# Patient Record
Sex: Female | Born: 1935 | Race: White | Hispanic: No | Marital: Married | Smoking: Never smoker
Health system: Southern US, Community
[De-identification: ages and names within clinical notes are randomized; demographics above are authoritative.]

## PROBLEM LIST (undated history)

## (undated) DIAGNOSIS — Z87898 Personal history of other specified conditions: Secondary | ICD-10-CM

## (undated) DIAGNOSIS — I10 Essential (primary) hypertension: Secondary | ICD-10-CM

## (undated) DIAGNOSIS — J45909 Unspecified asthma, uncomplicated: Secondary | ICD-10-CM

## (undated) DIAGNOSIS — M419 Scoliosis, unspecified: Secondary | ICD-10-CM

## (undated) DIAGNOSIS — K5790 Diverticulosis of intestine, part unspecified, without perforation or abscess without bleeding: Secondary | ICD-10-CM

## (undated) DIAGNOSIS — K219 Gastro-esophageal reflux disease without esophagitis: Secondary | ICD-10-CM

## (undated) DIAGNOSIS — C50919 Malignant neoplasm of unspecified site of unspecified female breast: Secondary | ICD-10-CM

## (undated) DIAGNOSIS — I2510 Atherosclerotic heart disease of native coronary artery without angina pectoris: Secondary | ICD-10-CM

## (undated) DIAGNOSIS — E119 Type 2 diabetes mellitus without complications: Secondary | ICD-10-CM

## (undated) DIAGNOSIS — E785 Hyperlipidemia, unspecified: Secondary | ICD-10-CM

## (undated) DIAGNOSIS — Z860100 Personal history of colon polyps, unspecified: Secondary | ICD-10-CM

## (undated) DIAGNOSIS — J449 Chronic obstructive pulmonary disease, unspecified: Secondary | ICD-10-CM

## (undated) DIAGNOSIS — F32A Depression, unspecified: Secondary | ICD-10-CM

## (undated) DIAGNOSIS — I509 Heart failure, unspecified: Secondary | ICD-10-CM

## (undated) DIAGNOSIS — G14 Postpolio syndrome: Secondary | ICD-10-CM

## (undated) DIAGNOSIS — C7951 Secondary malignant neoplasm of bone: Secondary | ICD-10-CM

## (undated) DIAGNOSIS — F329 Major depressive disorder, single episode, unspecified: Secondary | ICD-10-CM

## (undated) DIAGNOSIS — F0390 Unspecified dementia without behavioral disturbance: Secondary | ICD-10-CM

## (undated) DIAGNOSIS — G4730 Sleep apnea, unspecified: Secondary | ICD-10-CM

## (undated) DIAGNOSIS — K922 Gastrointestinal hemorrhage, unspecified: Secondary | ICD-10-CM

## (undated) DIAGNOSIS — I4891 Unspecified atrial fibrillation: Secondary | ICD-10-CM

## (undated) HISTORY — DX: Gastro-esophageal reflux disease without esophagitis: K21.9

## (undated) HISTORY — DX: Hyperlipidemia, unspecified: E78.5

## (undated) HISTORY — DX: Heart failure, unspecified: I50.9

## (undated) HISTORY — DX: Secondary malignant neoplasm of bone: C79.51

## (undated) HISTORY — DX: Sleep apnea, unspecified: G47.30

## (undated) HISTORY — DX: Postpolio syndrome: G14

## (undated) HISTORY — DX: Personal history of other specified conditions: Z87.898

## (undated) HISTORY — DX: Personal history of colon polyps, unspecified: Z86.0100

## (undated) HISTORY — DX: Diverticulosis of intestine, part unspecified, without perforation or abscess without bleeding: K57.90

## (undated) HISTORY — DX: Unspecified asthma, uncomplicated: J45.909

## (undated) HISTORY — DX: Personal history of colonic polyps: Z86.010

## (undated) HISTORY — DX: Malignant neoplasm of unspecified site of unspecified female breast: C50.919

## (undated) HISTORY — DX: Atherosclerotic heart disease of native coronary artery without angina pectoris: I25.10

## (undated) HISTORY — DX: Unspecified atrial fibrillation: I48.91

## (undated) HISTORY — DX: Major depressive disorder, single episode, unspecified: F32.9

## (undated) HISTORY — DX: Gastrointestinal hemorrhage, unspecified: K92.2

## (undated) HISTORY — DX: Type 2 diabetes mellitus without complications: E11.9

## (undated) HISTORY — DX: Essential (primary) hypertension: I10

## (undated) HISTORY — PX: BUNIONECTOMY: SHX129

## (undated) HISTORY — DX: Depression, unspecified: F32.A

## (undated) HISTORY — DX: Chronic obstructive pulmonary disease, unspecified: J44.9

## (undated) HISTORY — DX: Scoliosis, unspecified: M41.9

## (undated) HISTORY — PX: OTHER SURGICAL HISTORY: SHX169

## (undated) HISTORY — PX: PTCA: SHX146

---

## 1983-10-12 HISTORY — PX: APPENDECTOMY: SHX54

## 1983-10-12 HISTORY — PX: TOTAL ABDOMINAL HYSTERECTOMY: SHX209

## 1983-10-12 HISTORY — PX: CHOLECYSTECTOMY: SHX55

## 1998-10-11 HISTORY — PX: MASTECTOMY: SHX3

## 1999-03-23 ENCOUNTER — Ambulatory Visit: Admission: RE | Admit: 1999-03-23 | Discharge: 1999-03-23 | Payer: Self-pay | Admitting: Pulmonary Disease

## 1999-05-21 ENCOUNTER — Other Ambulatory Visit: Admission: RE | Admit: 1999-05-21 | Discharge: 1999-05-21 | Payer: Self-pay | Admitting: Internal Medicine

## 1999-06-02 ENCOUNTER — Encounter: Payer: Self-pay | Admitting: Internal Medicine

## 1999-06-02 ENCOUNTER — Encounter (INDEPENDENT_AMBULATORY_CARE_PROVIDER_SITE_OTHER): Payer: Self-pay | Admitting: Specialist

## 1999-06-02 ENCOUNTER — Ambulatory Visit (HOSPITAL_COMMUNITY): Admission: RE | Admit: 1999-06-02 | Discharge: 1999-06-02 | Payer: Self-pay | Admitting: Internal Medicine

## 1999-06-05 ENCOUNTER — Encounter: Admission: RE | Admit: 1999-06-05 | Discharge: 1999-09-03 | Payer: Self-pay | Admitting: *Deleted

## 1999-06-12 ENCOUNTER — Ambulatory Visit (HOSPITAL_COMMUNITY): Admission: RE | Admit: 1999-06-12 | Discharge: 1999-06-13 | Payer: Self-pay | Admitting: Surgery

## 2000-01-11 ENCOUNTER — Encounter: Admission: RE | Admit: 2000-01-11 | Discharge: 2000-01-11 | Payer: Self-pay | Admitting: Surgery

## 2000-01-11 ENCOUNTER — Encounter: Payer: Self-pay | Admitting: Surgery

## 2000-01-25 ENCOUNTER — Encounter: Admission: RE | Admit: 2000-01-25 | Discharge: 2000-01-25 | Payer: Self-pay | Admitting: Hematology and Oncology

## 2000-01-25 ENCOUNTER — Encounter: Payer: Self-pay | Admitting: Hematology and Oncology

## 2000-02-23 ENCOUNTER — Encounter: Payer: Self-pay | Admitting: Hematology and Oncology

## 2000-02-23 ENCOUNTER — Encounter: Admission: RE | Admit: 2000-02-23 | Discharge: 2000-02-23 | Payer: Self-pay | Admitting: Hematology and Oncology

## 2000-05-31 ENCOUNTER — Encounter: Admission: RE | Admit: 2000-05-31 | Discharge: 2000-05-31 | Payer: Self-pay | Admitting: Hematology and Oncology

## 2000-05-31 ENCOUNTER — Encounter: Payer: Self-pay | Admitting: Hematology and Oncology

## 2000-06-02 ENCOUNTER — Encounter: Admission: RE | Admit: 2000-06-02 | Discharge: 2000-06-02 | Payer: Self-pay | Admitting: Hematology and Oncology

## 2000-06-02 ENCOUNTER — Encounter: Payer: Self-pay | Admitting: Hematology and Oncology

## 2000-07-20 ENCOUNTER — Ambulatory Visit (HOSPITAL_COMMUNITY): Admission: RE | Admit: 2000-07-20 | Discharge: 2000-07-21 | Payer: Self-pay | Admitting: Cardiology

## 2001-02-22 ENCOUNTER — Encounter: Payer: Self-pay | Admitting: Hematology and Oncology

## 2001-02-22 ENCOUNTER — Encounter: Admission: RE | Admit: 2001-02-22 | Discharge: 2001-02-22 | Payer: Self-pay | Admitting: Hematology and Oncology

## 2001-06-27 ENCOUNTER — Encounter: Payer: Self-pay | Admitting: Internal Medicine

## 2001-06-27 ENCOUNTER — Encounter: Admission: RE | Admit: 2001-06-27 | Discharge: 2001-06-27 | Payer: Self-pay | Admitting: Internal Medicine

## 2001-07-14 ENCOUNTER — Encounter: Admission: RE | Admit: 2001-07-14 | Discharge: 2001-07-14 | Payer: Self-pay | Admitting: Internal Medicine

## 2001-07-14 ENCOUNTER — Encounter: Payer: Self-pay | Admitting: Internal Medicine

## 2001-08-16 ENCOUNTER — Encounter: Payer: Self-pay | Admitting: Hematology and Oncology

## 2001-08-16 ENCOUNTER — Encounter: Admission: RE | Admit: 2001-08-16 | Discharge: 2001-08-16 | Payer: Self-pay | Admitting: Oncology

## 2002-03-06 ENCOUNTER — Encounter: Admission: RE | Admit: 2002-03-06 | Discharge: 2002-03-06 | Payer: Self-pay | Admitting: Hematology and Oncology

## 2002-03-06 ENCOUNTER — Encounter: Payer: Self-pay | Admitting: Hematology and Oncology

## 2002-06-19 ENCOUNTER — Encounter: Payer: Self-pay | Admitting: Hematology and Oncology

## 2002-06-19 ENCOUNTER — Encounter: Admission: RE | Admit: 2002-06-19 | Discharge: 2002-06-19 | Payer: Self-pay | Admitting: Hematology and Oncology

## 2002-06-22 ENCOUNTER — Encounter: Payer: Self-pay | Admitting: Hematology and Oncology

## 2002-06-22 ENCOUNTER — Encounter: Admission: RE | Admit: 2002-06-22 | Discharge: 2002-06-22 | Payer: Self-pay | Admitting: Hematology and Oncology

## 2002-07-31 ENCOUNTER — Encounter: Admission: RE | Admit: 2002-07-31 | Discharge: 2002-07-31 | Payer: Self-pay | Admitting: *Deleted

## 2002-07-31 ENCOUNTER — Encounter: Payer: Self-pay | Admitting: *Deleted

## 2002-08-06 ENCOUNTER — Encounter: Payer: Self-pay | Admitting: *Deleted

## 2002-08-06 ENCOUNTER — Ambulatory Visit (HOSPITAL_COMMUNITY): Admission: RE | Admit: 2002-08-06 | Discharge: 2002-08-06 | Payer: Self-pay | Admitting: *Deleted

## 2003-01-25 ENCOUNTER — Encounter: Payer: Self-pay | Admitting: *Deleted

## 2003-01-25 ENCOUNTER — Ambulatory Visit (HOSPITAL_COMMUNITY): Admission: RE | Admit: 2003-01-25 | Discharge: 2003-01-25 | Payer: Self-pay | Admitting: *Deleted

## 2003-08-13 ENCOUNTER — Encounter: Admission: RE | Admit: 2003-08-13 | Discharge: 2003-08-13 | Payer: Self-pay | Admitting: Hematology & Oncology

## 2003-11-13 ENCOUNTER — Encounter: Payer: Self-pay | Admitting: Internal Medicine

## 2004-08-12 ENCOUNTER — Ambulatory Visit (HOSPITAL_COMMUNITY): Admission: RE | Admit: 2004-08-12 | Discharge: 2004-08-12 | Payer: Self-pay | Admitting: Oncology

## 2004-08-15 ENCOUNTER — Ambulatory Visit: Payer: Self-pay | Admitting: Oncology

## 2004-08-17 ENCOUNTER — Encounter: Admission: RE | Admit: 2004-08-17 | Discharge: 2004-08-17 | Payer: Self-pay | Admitting: Surgery

## 2005-02-15 ENCOUNTER — Ambulatory Visit: Payer: Self-pay | Admitting: Oncology

## 2005-02-24 ENCOUNTER — Ambulatory Visit (HOSPITAL_COMMUNITY): Admission: RE | Admit: 2005-02-24 | Discharge: 2005-02-24 | Payer: Self-pay | Admitting: Oncology

## 2005-03-14 ENCOUNTER — Inpatient Hospital Stay (HOSPITAL_COMMUNITY): Admission: EM | Admit: 2005-03-14 | Discharge: 2005-03-16 | Payer: Self-pay | Admitting: Emergency Medicine

## 2005-04-05 ENCOUNTER — Ambulatory Visit: Payer: Self-pay | Admitting: Oncology

## 2005-08-18 ENCOUNTER — Encounter: Admission: RE | Admit: 2005-08-18 | Discharge: 2005-08-18 | Payer: Self-pay | Admitting: Oncology

## 2006-01-18 ENCOUNTER — Ambulatory Visit: Payer: Self-pay | Admitting: Oncology

## 2006-02-21 ENCOUNTER — Ambulatory Visit (HOSPITAL_COMMUNITY): Admission: RE | Admit: 2006-02-21 | Discharge: 2006-02-21 | Payer: Self-pay | Admitting: Oncology

## 2006-03-25 ENCOUNTER — Encounter: Admission: RE | Admit: 2006-03-25 | Discharge: 2006-03-25 | Payer: Self-pay | Admitting: Orthopedic Surgery

## 2006-06-16 ENCOUNTER — Encounter: Admission: RE | Admit: 2006-06-16 | Discharge: 2006-06-16 | Payer: Self-pay | Admitting: Orthopedic Surgery

## 2006-08-22 ENCOUNTER — Encounter: Admission: RE | Admit: 2006-08-22 | Discharge: 2006-08-22 | Payer: Self-pay | Admitting: Oncology

## 2007-02-20 ENCOUNTER — Encounter: Admission: RE | Admit: 2007-02-20 | Discharge: 2007-02-20 | Payer: Self-pay | Admitting: Internal Medicine

## 2007-02-24 ENCOUNTER — Encounter: Admission: RE | Admit: 2007-02-24 | Discharge: 2007-02-24 | Payer: Self-pay | Admitting: Orthopedic Surgery

## 2007-02-24 ENCOUNTER — Ambulatory Visit: Payer: Self-pay | Admitting: Oncology

## 2007-02-27 LAB — CBC WITH DIFFERENTIAL (CANCER CENTER ONLY)
BASO%: 0.5 % (ref 0.0–2.0)
EOS%: 1.2 % (ref 0.0–7.0)
LYMPH%: 12.8 % — ABNORMAL LOW (ref 14.0–48.0)
MCH: 30 pg (ref 26.0–34.0)
MCHC: 33.7 g/dL (ref 32.0–36.0)
MCV: 89 fL (ref 81–101)
MONO%: 6.6 % (ref 0.0–13.0)
NEUT#: 9.1 10*3/uL — ABNORMAL HIGH (ref 1.5–6.5)
Platelets: 249 10*3/uL (ref 145–400)
RBC: 4.48 10*6/uL (ref 3.70–5.32)
RDW: 13.2 % (ref 10.5–14.6)

## 2007-02-27 LAB — COMPREHENSIVE METABOLIC PANEL
Albumin: 4 g/dL (ref 3.5–5.2)
CO2: 34 mEq/L — ABNORMAL HIGH (ref 19–32)
Calcium: 10 mg/dL (ref 8.4–10.5)
Chloride: 101 mEq/L (ref 96–112)
Glucose, Bld: 126 mg/dL — ABNORMAL HIGH (ref 70–99)
Potassium: 4.2 mEq/L (ref 3.5–5.3)
Sodium: 144 mEq/L (ref 135–145)
Total Protein: 6.3 g/dL (ref 6.0–8.3)

## 2007-02-27 LAB — LACTATE DEHYDROGENASE: LDH: 129 U/L (ref 94–250)

## 2007-02-27 LAB — CANCER ANTIGEN 27.29: CA 27.29: 19 U/mL (ref 0–39)

## 2007-03-03 ENCOUNTER — Ambulatory Visit (HOSPITAL_COMMUNITY): Admission: RE | Admit: 2007-03-03 | Discharge: 2007-03-03 | Payer: Self-pay | Admitting: Oncology

## 2007-07-14 ENCOUNTER — Encounter: Payer: Self-pay | Admitting: Internal Medicine

## 2007-08-24 ENCOUNTER — Encounter: Admission: RE | Admit: 2007-08-24 | Discharge: 2007-08-24 | Payer: Self-pay | Admitting: Oncology

## 2007-08-25 ENCOUNTER — Ambulatory Visit: Payer: Self-pay | Admitting: Oncology

## 2007-08-28 LAB — CBC WITH DIFFERENTIAL (CANCER CENTER ONLY)
BASO%: 0.6 % (ref 0.0–2.0)
Eosinophils Absolute: 0.2 10*3/uL (ref 0.0–0.5)
LYMPH%: 15.8 % (ref 14.0–48.0)
MONO#: 0.6 10*3/uL (ref 0.1–0.9)
NEUT#: 6.8 10*3/uL — ABNORMAL HIGH (ref 1.5–6.5)
Platelets: 202 10*3/uL (ref 145–400)
RBC: 4.43 10*6/uL (ref 3.70–5.32)
RDW: 13.2 % (ref 10.5–14.6)
WBC: 9 10*3/uL (ref 3.9–10.0)

## 2007-08-28 LAB — COMPREHENSIVE METABOLIC PANEL
ALT: 12 U/L (ref 0–35)
AST: 12 U/L (ref 0–37)
Creatinine, Ser: 0.79 mg/dL (ref 0.40–1.20)
Total Bilirubin: 1.2 mg/dL (ref 0.3–1.2)

## 2007-09-04 ENCOUNTER — Encounter: Admission: RE | Admit: 2007-09-04 | Discharge: 2007-09-04 | Payer: Self-pay | Admitting: Internal Medicine

## 2008-04-11 ENCOUNTER — Encounter: Admission: RE | Admit: 2008-04-11 | Discharge: 2008-07-10 | Payer: Self-pay | Admitting: Internal Medicine

## 2008-08-21 ENCOUNTER — Ambulatory Visit: Payer: Self-pay | Admitting: Oncology

## 2008-08-26 ENCOUNTER — Encounter: Admission: RE | Admit: 2008-08-26 | Discharge: 2008-08-26 | Payer: Self-pay | Admitting: Internal Medicine

## 2008-08-27 LAB — CMP (CANCER CENTER ONLY)
Alkaline Phosphatase: 35 U/L (ref 26–84)
BUN, Bld: 12 mg/dL (ref 7–22)
Glucose, Bld: 191 mg/dL — ABNORMAL HIGH (ref 73–118)
Sodium: 144 mEq/L (ref 128–145)
Total Bilirubin: 1.4 mg/dl (ref 0.20–1.60)
Total Protein: 6.5 g/dL (ref 6.4–8.1)

## 2008-08-27 LAB — CBC WITH DIFFERENTIAL (CANCER CENTER ONLY)
BASO#: 0.1 10*3/uL (ref 0.0–0.2)
EOS%: 1.9 % (ref 0.0–7.0)
Eosinophils Absolute: 0.2 10*3/uL (ref 0.0–0.5)
HGB: 12.7 g/dL (ref 11.6–15.9)
LYMPH%: 17.3 % (ref 14.0–48.0)
MCH: 29.6 pg (ref 26.0–34.0)
MCHC: 33.6 g/dL (ref 32.0–36.0)
MCV: 88 fL (ref 81–101)
MONO%: 6.7 % (ref 0.0–13.0)
NEUT#: 5.7 10*3/uL (ref 1.5–6.5)
NEUT%: 73.3 % (ref 39.6–80.0)

## 2009-02-03 ENCOUNTER — Encounter: Payer: Self-pay | Admitting: Internal Medicine

## 2009-02-07 ENCOUNTER — Encounter: Payer: Self-pay | Admitting: Internal Medicine

## 2009-02-12 DIAGNOSIS — E785 Hyperlipidemia, unspecified: Secondary | ICD-10-CM

## 2009-02-12 DIAGNOSIS — I10 Essential (primary) hypertension: Secondary | ICD-10-CM | POA: Insufficient documentation

## 2009-02-12 DIAGNOSIS — E119 Type 2 diabetes mellitus without complications: Secondary | ICD-10-CM

## 2009-02-13 ENCOUNTER — Ambulatory Visit: Payer: Self-pay | Admitting: Internal Medicine

## 2009-02-13 ENCOUNTER — Encounter: Payer: Self-pay | Admitting: Internal Medicine

## 2009-02-13 ENCOUNTER — Ambulatory Visit: Admission: RE | Admit: 2009-02-13 | Discharge: 2009-02-13 | Payer: Self-pay | Admitting: Internal Medicine

## 2009-02-13 DIAGNOSIS — M4120 Other idiopathic scoliosis, site unspecified: Secondary | ICD-10-CM | POA: Insufficient documentation

## 2009-02-13 DIAGNOSIS — I2510 Atherosclerotic heart disease of native coronary artery without angina pectoris: Secondary | ICD-10-CM | POA: Insufficient documentation

## 2009-02-14 ENCOUNTER — Encounter: Payer: Self-pay | Admitting: Internal Medicine

## 2009-02-14 LAB — CONVERTED CEMR LAB
CK-MB: 1.6 ng/mL (ref 0.3–4.0)
Total CK: 34 units/L (ref 7–177)
Troponin I: 0.05 ng/mL (ref <0.06)

## 2009-02-21 ENCOUNTER — Ambulatory Visit: Payer: Self-pay | Admitting: Internal Medicine

## 2009-02-21 ENCOUNTER — Telehealth: Payer: Self-pay | Admitting: Internal Medicine

## 2009-04-03 ENCOUNTER — Encounter (HOSPITAL_COMMUNITY): Admission: RE | Admit: 2009-04-03 | Discharge: 2009-07-02 | Payer: Self-pay | Admitting: Internal Medicine

## 2009-05-15 ENCOUNTER — Ambulatory Visit: Payer: Self-pay | Admitting: Pulmonary Disease

## 2009-05-15 ENCOUNTER — Telehealth (INDEPENDENT_AMBULATORY_CARE_PROVIDER_SITE_OTHER): Payer: Self-pay | Admitting: *Deleted

## 2009-05-21 ENCOUNTER — Encounter: Payer: Self-pay | Admitting: Pulmonary Disease

## 2009-05-23 ENCOUNTER — Encounter: Payer: Self-pay | Admitting: Pulmonary Disease

## 2009-05-23 ENCOUNTER — Ambulatory Visit: Payer: Self-pay | Admitting: Internal Medicine

## 2009-05-30 ENCOUNTER — Telehealth: Payer: Self-pay | Admitting: Internal Medicine

## 2009-06-29 ENCOUNTER — Encounter: Payer: Self-pay | Admitting: Internal Medicine

## 2009-08-04 ENCOUNTER — Encounter: Payer: Self-pay | Admitting: Internal Medicine

## 2009-08-05 ENCOUNTER — Inpatient Hospital Stay (HOSPITAL_COMMUNITY): Admission: EM | Admit: 2009-08-05 | Discharge: 2009-08-08 | Payer: Self-pay | Admitting: Emergency Medicine

## 2009-08-25 ENCOUNTER — Encounter: Admission: RE | Admit: 2009-08-25 | Discharge: 2009-08-25 | Payer: Self-pay | Admitting: Internal Medicine

## 2009-08-25 ENCOUNTER — Ambulatory Visit: Payer: Self-pay | Admitting: Oncology

## 2009-08-26 LAB — CBC WITH DIFFERENTIAL (CANCER CENTER ONLY)
BASO#: 0 10*3/uL (ref 0.0–0.2)
EOS%: 2.3 % (ref 0.0–7.0)
Eosinophils Absolute: 0.2 10*3/uL (ref 0.0–0.5)
HCT: 40.1 % (ref 34.8–46.6)
HGB: 13.4 g/dL (ref 11.6–15.9)
LYMPH#: 1.2 10*3/uL (ref 0.9–3.3)
MCH: 31.4 pg (ref 26.0–34.0)
MCHC: 33.4 g/dL (ref 32.0–36.0)
NEUT%: 73.2 % (ref 39.6–80.0)
RBC: 4.27 10*6/uL (ref 3.70–5.32)

## 2009-08-26 LAB — CMP (CANCER CENTER ONLY)
Albumin: 3.3 g/dL (ref 3.3–5.5)
Alkaline Phosphatase: 47 U/L (ref 26–84)
Glucose, Bld: 156 mg/dL — ABNORMAL HIGH (ref 73–118)
Potassium: 4.2 mEq/L (ref 3.3–4.7)
Sodium: 148 mEq/L — ABNORMAL HIGH (ref 128–145)
Total Protein: 6.3 g/dL — ABNORMAL LOW (ref 6.4–8.1)

## 2010-08-04 ENCOUNTER — Ambulatory Visit: Payer: Self-pay | Admitting: Internal Medicine

## 2010-08-13 ENCOUNTER — Ambulatory Visit: Payer: Self-pay | Admitting: Oncology

## 2010-08-26 LAB — CBC WITH DIFFERENTIAL (CANCER CENTER ONLY)
BASO#: 0 10*3/uL (ref 0.0–0.2)
BASO%: 0.4 % (ref 0.0–2.0)
EOS%: 1.7 % (ref 0.0–7.0)
Eosinophils Absolute: 0.1 10*3/uL (ref 0.0–0.5)
HCT: 37.7 % (ref 34.8–46.6)
HGB: 12.9 g/dL (ref 11.6–15.9)
LYMPH#: 1.2 10*3/uL (ref 0.9–3.3)
LYMPH%: 15.9 % (ref 14.0–48.0)
MCH: 30.2 pg (ref 26.0–34.0)
MCHC: 34.2 g/dL (ref 32.0–36.0)
MCV: 88 fL (ref 81–101)
MONO#: 0.5 10*3/uL (ref 0.1–0.9)
MONO%: 6.4 % (ref 0.0–13.0)
NEUT#: 5.9 10*3/uL (ref 1.5–6.5)
NEUT%: 75.6 % (ref 39.6–80.0)
Platelets: 187 10*3/uL (ref 145–400)
RBC: 4.27 10*6/uL (ref 3.70–5.32)
RDW: 12.2 % (ref 10.5–14.6)
WBC: 7.7 10*3/uL (ref 3.9–10.0)

## 2010-08-26 LAB — CMP (CANCER CENTER ONLY)
ALT(SGPT): 15 U/L (ref 10–47)
AST: 19 U/L (ref 11–38)
Albumin: 3.7 g/dL (ref 3.3–5.5)
Alkaline Phosphatase: 36 U/L (ref 26–84)
BUN, Bld: 16 mg/dL (ref 7–22)
CO2: 35 mEq/L — ABNORMAL HIGH (ref 18–33)
Calcium: 10.2 mg/dL (ref 8.0–10.3)
Chloride: 98 mEq/L (ref 98–108)
Creat: 0.7 mg/dl (ref 0.6–1.2)
Glucose, Bld: 177 mg/dL — ABNORMAL HIGH (ref 73–118)
Potassium: 4.3 mEq/L (ref 3.3–4.7)
Sodium: 143 mEq/L (ref 128–145)
Total Bilirubin: 1 mg/dl (ref 0.20–1.60)
Total Protein: 6.5 g/dL (ref 6.4–8.1)

## 2010-08-26 LAB — CANCER ANTIGEN 27.29: CA 27.29: 21 U/mL (ref 0–39)

## 2010-08-28 ENCOUNTER — Encounter: Admission: RE | Admit: 2010-08-28 | Discharge: 2010-08-28 | Payer: Self-pay | Admitting: Internal Medicine

## 2010-09-22 ENCOUNTER — Telehealth: Payer: Self-pay | Admitting: Internal Medicine

## 2010-09-25 ENCOUNTER — Ambulatory Visit: Payer: Self-pay | Admitting: Internal Medicine

## 2010-10-01 LAB — CONVERTED CEMR LAB
BUN: 15 mg/dL (ref 6–23)
Basophils Absolute: 0 10*3/uL (ref 0.0–0.1)
Basophils Relative: 0.3 % (ref 0.0–3.0)
CO2: 37 meq/L — ABNORMAL HIGH (ref 19–32)
Calcium: 9.8 mg/dL (ref 8.4–10.5)
Chloride: 98 meq/L (ref 96–112)
Creatinine, Ser: 0.8 mg/dL (ref 0.4–1.2)
Eosinophils Absolute: 0.1 10*3/uL (ref 0.0–0.7)
Eosinophils Relative: 1.7 % (ref 0.0–5.0)
GFR calc non Af Amer: 80.09 mL/min (ref >60.00)
Glucose, Bld: 240 mg/dL — ABNORMAL HIGH (ref 70–99)
HCT: 37.3 % (ref 36.0–46.0)
Hemoglobin: 12.4 g/dL (ref 12.0–15.0)
Lymphocytes Relative: 12.3 % (ref 12.0–46.0)
Lymphs Abs: 1 10*3/uL (ref 0.7–4.0)
MCHC: 33.2 g/dL (ref 30.0–36.0)
MCV: 91.8 fL (ref 78.0–100.0)
Monocytes Absolute: 0.6 10*3/uL (ref 0.1–1.0)
Monocytes Relative: 6.9 % (ref 3.0–12.0)
Neutro Abs: 6.6 10*3/uL (ref 1.4–7.7)
Neutrophils Relative %: 78.8 % — ABNORMAL HIGH (ref 43.0–77.0)
Platelets: 224 10*3/uL (ref 150.0–400.0)
Potassium: 4.1 meq/L (ref 3.5–5.1)
Pro B Natriuretic peptide (BNP): 145.4 pg/mL — ABNORMAL HIGH (ref 0.0–100.0)
RBC: 4.07 M/uL (ref 3.87–5.11)
RDW: 13.4 % (ref 11.5–14.6)
Sodium: 142 meq/L (ref 135–145)
WBC: 8.4 10*3/uL (ref 4.5–10.5)

## 2010-10-27 ENCOUNTER — Encounter: Payer: Self-pay | Admitting: Internal Medicine

## 2010-10-27 ENCOUNTER — Ambulatory Visit
Admission: RE | Admit: 2010-10-27 | Discharge: 2010-10-27 | Payer: Self-pay | Source: Home / Self Care | Attending: Internal Medicine | Admitting: Internal Medicine

## 2010-11-02 ENCOUNTER — Encounter: Payer: Self-pay | Admitting: Internal Medicine

## 2010-11-06 ENCOUNTER — Encounter: Payer: Self-pay | Admitting: Internal Medicine

## 2010-11-10 NOTE — Assessment & Plan Note (Signed)
Summary: breathing problem/ mbw   Visit Type:  Follow-up Copy to:  Dr. Pearson Grippe Primary Provider/Referring Provider:  Dr. Pearson Grippe  CC:  follow up. pt states she feels like she can't catch her breath. pt c/o sob with activity and getting worse.  pt wears o2 all day and cpap at night. pt denies any cough.  History of Present Illness: 77 yo female with dyspnea likely from Hypoxemia, Scoliosis, and OSA and Abn PFT( c/w restriction from scoliosis,  airtrapping ? cause, and low dlco - ? cause - maintained on COPD Rx)    OV 05/23/2009: Saw Dr Craige Cotta last week becuase she was desaturating and getting dyspneic with exertion while at rehab. She was advised to increase her o2 from 2L at rest to 3L with exertion. With this measure she feels beter. No active complaints. Enjoys rehab. Baseline dyspnea and cough are unchanged   August 04, 2010: Followup viist after nearly a year. In interim, was admitted to [facility] O[facility] 2010 for atypical chest pain. Reviw of records shows Left heart cath normal. No right heart cath performed. COmpleted rehab 1 year ago. [employer] regularly. Feels exercise is not helping. Now, hHas noticed worsening dyspnea last several months even for smiple activities like going across room. O2 upped by husband to 4L based on finger pulse ox probe at home. Husband feels active exercise of polio patient is fatiguging muscles and making her more dyspneic. Denies asspciated chest pain, cough, wheeze, edema, hemoptysius, sputum, nausea, vomit, diarrhea, syncope, orthopnea, pnd.     Preventive Screening-Counseling & Management  Alcohol-Tobacco     Smoking Status: never  Current Medications (verified): 1)  Spiriva Handihaler 18 Mcg Caps (Tiotropium Bromide Monohydrate) .... One Capsule Inhaled Once A Day 2)  Advair Diskus 250-50 Mcg/dose Misc (Fluticasone-Salmeterol) .... One Puff Two Times A Day 3)  Allegra 180 Mg Tabs (Fexofenadine Hcl) .... Take 1 Tablet By Mouth Once A Day As  Needed 4)  Glimepiride 2 Mg Tabs (Glimepiride) .... Take 1 Tablet By Mouth Once A Day 5)  Adult Aspirin Ec Low Strength 81 Mg Tbec (Aspirin) .... Two Tablets A Day 6)  Plavix 75 Mg Tabs (Clopidogrel Bisulfate) .... Take 1 Tablet By Mouth Once A Day 7)  Digoxin 0.25 Mg Tabs (Digoxin) .... Take 1 Tablet By Mouth Once A Day 8)  Diovan 160 Mg Tabs (Valsartan) .... 1/2 Tablet in Am and Pm 9)  Isosorbide Mononitrate Cr 30 Mg Xr24h-Tab (Isosorbide Mononitrate) .... Once Daily 10)  Januvia 100 Mg Tabs (Sitagliptin Phosphate) .... Take 1 Tablet By Mouth Once A Day 11)  Pravastatin Sodium 80 Mg Tabs (Pravastatin Sodium) .... Take 1 Tablet By Mouth Once A Day 12)  Metoprolol Tartrate 50 Mg Tabs (Metoprolol Tartrate) .... Take 1 Tablet By Mouth Once A Day 13)  Nitroglycerin 0.4 Mg Subl (Nitroglycerin) .... As Needed 14)  Protonix 40 Mg Tbec (Pantoprazole Sodium) .... Take 1 Tablet By Mouth Once A Day 15)  Torsemide 20 Mg Tabs (Torsemide) .... Take 1 Tablet By Mouth Once A Day As Needed 16)  Folic Acid 800 Mcg Tabs (Folic Acid) .... Take 1 Tablet By Mouth Once A Day 17)  Glucosamine Sulfate 750 Mg Tabs (Glucosamine Sulfate) .... Take 1 Tablet By Mouth Three Times A Day 18)  Multivitamins  Tabs (Multiple Vitamin) .... Take 1 Tablet By Mouth Once A Day 19)  Tylenol Pm Extra Strength 500-25 Mg Tabs (Diphenhydramine-Apap (Sleep)) .... 1/2 A Day As Needed Sleep 20)  Propoxyphene N-Apap 100-650  Mg Tabs (Propoxyphene N-Apap) .... Take 1 Tablet By Mouth Once A Day As Needed 21)  Singulair 10 Mg Tabs (Montelukast Sodium) .... Once Daily As Needed 22)  Oxycodone-Acetaminophen 7.5-325 Mg Tabs (Oxycodone-Acetaminophen) .... Once Daily  Allergies (verified): 1)  ! Persantine 2)  ! * Dicloxicilin  Past History:  Past medical, surgical, family and social histories (including risk factors) reviewed, and no changes noted (except as noted below).  Past Medical History: Reviewed history from 05/23/2009 and no changes  required. #Diabetes, Type 2 #Hyperlipidemia #Hypertension #Atrial Fibrillation #CAD - snce 1990s. Most of care in [place] - 2001 Cath at [facility] by Dr Debby Freiberg - Single 95% RCA with normal LVEF - s/p co stent  - last one 2004. Typical symptoms are chest congestion but not chest pain #"MIXED RESTRICTIVE-OBSTRUCTIVE PFT ABNORMALITY with DYSPNEA and HYPOXEMIA - Labelled as COPD but never smoked, no childhood asthma, no occ dust exposure.  - 07/14/2007 CXR - scoliosis - ABG 02/13/2009: 7.4/53/53.  - PFTs 02/13/2009 done on copd Rx (spiriva and advair). Cleda Daub shows restriction. Lung volumes suggests air trapping. Diffusion is very severely low. : FVC 1L/50%, FeV1 1L/72%, Ratio 98 (72), Fef 25-75% 1.6L/86%, TLCO 3.6/102%, RV 2.6/177%, RV/TLC 72% (41), DLCO 8.3/46 #Sleep Apnea #Hx of Breast Cancer with Right mastectomy 06/12/99 - negative bone scan 2008 per echart - negative mammogram 08/2008 per echart #Hx of Gi Bleed UGI And LGI bleed and GERD #Post Polio Syndrome with severe scoliosis - severe scoliosois with progressive loss of ht - chronic back pain - uses stimulator Cataracts# #Labs 02/04/2009: Alb 3.9, Creat 0.6, Glucose 100, BUN 13, AST 7, ALT 9,  HGB A1C 6.6  Past Surgical History: Reviewed history from 02/13/2009 and no changes required. [id number] Bunionectomy Left Elbow surgery Right Mastectomy 2000 Gsllbladder -1980 Appendix-1985  Family History: Reviewed history from 02/12/2009 and no changes required. Father-died at age 10 from MI Mother- bladder Cancer, hypercalcemia  Social History: Reviewed history from 02/12/2009 and no changes required. Married 4 children-all boys Never Smoked No ETOH useSmoking Status:  never  Review of Systems       The patient complains of shortness of breath with activity, acid heartburn, nasal congestion/difficulty breathing through nose, hand/feet swelling, and joint stiffness or pain.  The patient denies shortness of breath at rest, productive  cough, non-productive cough, coughing up blood, chest pain, irregular heartbeats, indigestion, loss of appetite, weight change, abdominal pain, difficulty swallowing, sore throat, tooth/dental problems, headaches, sneezing, itching, ear ache, anxiety, depression, rash, change in color of mucus, and fever.    Vital Signs:  Patient profile:   77 year old female Height:      58 inches Weight:      151.25 pounds BMI:     31.73 O2 Sat:      96 % on 3 L/min Temp:     98.0 degrees F oral Pulse rate:   69 / minute BP sitting:   130 / 68  (left arm) Cuff size:   regular  Vitals Entered By: Carver Fila (August 04, 2010 1:54 PM)  O2 Flow:  3 L/min CC: follow up. pt states she feels like she can't catch her breath. pt c/o sob with activity and getting worse.  pt wears o2 all day and cpap at night. pt denies any cough Comments meds and allergies updated Phone number updated Carver Fila  August 04, 2010 1:55 PM    Physical Exam  General:  short woman sitting in wheel chair with o2 on Head:  normocephalic and atraumatic Eyes:  PERRLA/EOM intact; conjunctiva and sclera clear Ears:  TMs intact and clear with normal canals Nose:  no deformity, discharge, inflammation, or lesions Mouth:  no deformity or lesions Neck:  no masses, thyromegaly, or abnormal cervical nodes Chest Wall:  severe S shapped scoliosis small chest barrell chest Lungs:  coarse BS throughout and prolonged exhilation.   Heart:  regular rate and rhythm, S1, S2 without rubs, gallops, or clicks. Murmur NOS present.  Abdomen:  bowel sounds positive; abdomen soft and non-tender without masses, or organomegaly Msk:  no deformity or scoliosis noted with normal posture Pulses:  pulses normal Extremities:  minimal ankle edema Neurologic:  CN II-XII grossly intact with normal reflexes, coordination, muscle strength and tone Skin:  intact without lesions or rashes Cervical Nodes:  no significant adenopathy Axillary Nodes:  no  significant adenopathy Psych:  alert and cooperative; normal mood and affect; normal attention span and concentration   Impression & Recommendations:  Problem # 1:  SHORTNESS OF BREATH (SOB) (ICD-786.05) Assessment Deteriorated  >I think dyspnea is multifactorial. Primary contributor is restrictive chest diseaes from severe S shaped scoliosis and from large hiatal hernia that is pushing into her left chest (hernia is unchanged from 2004 cxr). She is labelled to have COPD (per her) which I suspect is due to air trapping on lung volumes and reduced diffusion. However, I think she might or might not have asthma ( given hx of variable peak flow) or has air trapping due to years of scoliosis.  Since Summer 2010  -> rehab an exercise have not helped. Left heart cath oct 2010 is nromal.  Fall 2011 - worsening dyspnea: suspect early decompnesation of right heart falure but husband and patient think this is due to resistancet training at [facility] in setting of polio  PLAN Continue o2 Stop ecercises at [facility] at their request Patient will call back in 1 month to report progres in dyspne and hypoxemia If still persists, can check bNP Explained that corpulmonale is an eventuality and once that happens, no specific Rx options available  Orders: Est. Patient Level II ([id number])  Medications Added to Medication List This Visit: 1)  Isosorbide Mononitrate Cr 30 Mg Xr24h-tab (Isosorbide mononitrate) .... Once daily 2)  Singulair 10 Mg Tabs (Montelukast sodium) .... Once daily as needed 3)  Oxycodone-acetaminophen 7.5-325 Mg Tabs (Oxycodone-acetaminophen) .... Once daily  Patient Instructions: 1)  please stop [employer] exercises 2)  call me in 1 month to report progrss 3)  monitor your oxygen levels   Immunization History:  Influenza Immunization History:    Influenza:  historical (06/11/2010)

## 2010-11-12 NOTE — Assessment & Plan Note (Signed)
Summary: sob//jrc   Visit Type:  Follow-up Copy to:  Dr. Pearson Grippe Primary Provider/Referring Provider:  Dr. Pearson Grippe  CC:  Pt here for follow-up. Pt states she has been having increased SOB x 3 months.  pt states when she walks around 2feet her oxygen levels drop below 90%. .  History of Present Illness: 77 yo female with dyspnea likely from Hypoxemia, Scoliosis, and OSA and Abn PFT( c/w restriction from scoliosis,  airtrapping ? cause, and low dlco - ? cause - maintained on COPD Rx)    August 04, 2010: Followup viist after nearly a year. COmpleted rehab 1 year ago.  In interim, was admitted to [facility] O[facility] 2010 for atypical chest pain. Reviw of records shows Left heart cath normal showed patent LAD and RCA stent. No right heart cath performed. [employer] regularly.  Now, has noticed worsening dyspnea last several months even for smiple activities like going across room. O2 upped by husband to 4L based on finger pulse ox probe at home. Husband feels active exercise of polio patient is fatiguging muscles and making her more dyspneic. Denies asspciated chest pain, cough, wheeze, edema, hemoptysius, sputum, nausea, vomit, diarrhea, syncope, orthopnea, pnd. WJX:BJYN [employer] exercises, REASSESS OVER PHONE IN 1 MONTH, DO LABS -> BNP 145, Hgb 12.5gm%. ROV 3 months   October 27, 2010: Followup for above. States that since labor day weekend dyspneic with minimal exertion. This is worse than baseline. Husband states something changed around that time. Stopping [employer] exercises have not helped. Dyspnea is progressive. More hypoxemic too. Does hvae occ heart burns that mimic old gerd v CAD. No definite chest pains, worsening edema, syncope, nausea, vomit, diarrhea. She does have intentional weight loss due to diet.  Spirometry today shows no change comparted to past     Preventive Screening-Counseling & Management  Alcohol-Tobacco     Smoking Status: never  Current Medications (verified): 1)  Spiriva  Handihaler 18 Mcg Caps (Tiotropium Bromide Monohydrate) .... One Capsule Inhaled Once A Day 2)  Advair Diskus 250-50 Mcg/dose Misc (Fluticasone-Salmeterol) .... One Puff Two Times A Day 3)  Allegra 180 Mg Tabs (Fexofenadine Hcl) .... Take 1 Tablet By Mouth Once A Day As Needed 4)  Glimepiride 2 Mg Tabs (Glimepiride) .... Take 1 Tablet By Mouth Once A Day 5)  Adult Aspirin Ec Low Strength 81 Mg Tbec (Aspirin) .... Two Tablets A Day 6)  Plavix 75 Mg Tabs (Clopidogrel Bisulfate) .... Take 1 Tablet By Mouth Once A Day 7)  Digoxin 0.25 Mg Tabs (Digoxin) .... Take 1 Tablet By Mouth Once A Day 8)  Diovan 160 Mg Tabs (Valsartan) .... 1/2 Tablet in Am and Pm 9)  Isosorbide Mononitrate Cr 30 Mg Xr24h-Tab (Isosorbide Mononitrate) .... Once Daily 10)  Januvia 100 Mg Tabs (Sitagliptin Phosphate) .... Take 1 Tablet By Mouth Once A Day 11)  Pravastatin Sodium 80 Mg Tabs (Pravastatin Sodium) .... Take 1 Tablet By Mouth Once A Day 12)  Metoprolol Tartrate 50 Mg Tabs (Metoprolol Tartrate) .... Take 1 Tablet By Mouth Once A Day 13)  Nitroglycerin 0.4 Mg Subl (Nitroglycerin) .... As Needed 14)  Protonix 40 Mg Tbec (Pantoprazole Sodium) .... Take 1 Tablet By Mouth Once A Day 15)  Torsemide 20 Mg Tabs (Torsemide) .... Take 1 Tablet By Mouth Once A Day As Needed 16)  Folic Acid 800 Mcg Tabs (Folic Acid) .... Take 1 Tablet By Mouth Once A Day 17)  Glucosamine Sulfate 750 Mg Tabs (Glucosamine Sulfate) .... Take 1 Tablet By  Mouth Three Times A Day 18)  Multivitamins  Tabs (Multiple Vitamin) .... Take 1 Tablet By Mouth Once A Day 19)  Tylenol Pm Extra Strength 500-25 Mg Tabs (Diphenhydramine-Apap (Sleep)) .... 1/2 A Day As Needed Sleep 20)  Propoxyphene N-Apap 100-650 Mg Tabs (Propoxyphene N-Apap) .... Take 1 Tablet By Mouth Once A Day As Needed 21)  Singulair 10 Mg Tabs (Montelukast Sodium) .... Once Daily As Needed 22)  Oxycodone-Acetaminophen 7.5-325 Mg Tabs (Oxycodone-Acetaminophen) .... Once Daily  Allergies  (verified): 1)  ! Persantine 2)  ! * Dicloxicilin  Past History:  Past medical, surgical, family and social histories (including risk factors) reviewed, and no changes noted (except as noted below).  Past Medical History: #Diabetes, Type 2 #Hyperlipidemia #Hypertension #Atrial Fibrillation  - intolerant to coumadin in [place] in 2005 #CAD - snce 1990s. Typical symptoms are chest congestion but never chest pain - 2001 Cath at [facility] by Dr Debby Freiberg - Single 95% RCA with normal LVEF - 2003 CHF admit and RCA stent per hx - done in [place] -2005 [place], [place] - LAD stent placed 80% -> 0. RCA stent patent, 99% small diag lesion - no intervention - 2010 October Cath at [facility] - atypical heart burn after meal. Cath - patent stents #"MIXED RESTRICTIVE-OBSTRUCTIVE PFT ABNORMALITY with DYSPNEA and HYPOXEMIA - Labelled as COPD but never smoked, no childhood asthma, no occ dust exposure.  - 07/14/2007 CXR - scoliosis - ABG 02/13/2009: 7.4/53/53.  - PFTs 02/13/2009 done on copd Rx (spiriva and advair). Cleda Daub shows restriction. Lung volumes suggests air trapping. Diffusion is very severely low. : FVC 1L/50%, FeV1 1L/72%, Ratio 98 (72), Fef 25-75% 1.6L/86%, TLCO 3.6/102%, RV 2.6/177%, RV/TLC 72% (41), DLCO 8.3/46 - Spiro 10/27/2010 - FVC 1L/49%, Fev1 0.8L/50%, rati 76 - no change #Sleep Apnea #Hx of Breast Cancer with Right mastectomy 06/12/99 - negative bone scan 2008 per echart - negative mammogram 08/2008 per echart #Hx of Gi Bleed UGI And LGI bleed and GERD #Post Polio Syndrome with severe scoliosis - severe scoliosois with progressive loss of ht - chronic back pain - uses stimulator Cataracts# #Labs 02/04/2009: Alb 3.9, Creat 0.6, Glucose 100, BUN 13, AST 7, ALT 9,  HGB A1C 6.6  Past Surgical History: Reviewed history from 02/13/2009 and no changes required. [id number] Bunionectomy Left Elbow surgery Right Mastectomy 2000 Gsllbladder -1980 Appendix-1985  Family History: Reviewed history from  02/12/2009 and no changes required. Father-died at age 25 from MI Mother- bladder Cancer, hypercalcemia  Social History: Reviewed history from 02/12/2009 and no changes required. Married 4 children-all boys Never Smoked No ETOH use  Review of Systems       The patient complains of shortness of breath with activity.  The patient denies shortness of breath at rest, productive cough, non-productive cough, coughing up blood, chest pain, irregular heartbeats, acid heartburn, indigestion, loss of appetite, weight change, abdominal pain, difficulty swallowing, sore throat, tooth/dental problems, headaches, nasal congestion/difficulty breathing through nose, sneezing, itching, ear ache, anxiety, depression, hand/feet swelling, joint stiffness or pain, rash, change in color of mucus, and fever.    Vital Signs:  Patient profile:   77 year old female Height:      58 inches Weight:      149.13 pounds BMI:     31.28 O2 Sat:      96 % on 3 L/min Temp:     98.3 degrees F oral Pulse rate:   65 / minute BP sitting:   122 / 70  (right arm) Cuff size:  regular  Vitals Entered By: Carron Curie CMA (October 27, 2010 3:45 PM)  O2 Flow:  3 L/min  O2 Sat Comments Took pt off of oxygen for and rechecked sats and she was at 86% at rest on room air. Carron Curie CMA  October 27, 2010 4:15 PM   Physical Exam  General:  short woman sitting in wheel chair with o2 on pulse ox 85% on Room air at rest Head:  normocephalic and atraumatic Eyes:  PERRLA/EOM intact; conjunctiva and sclera clear Ears:  TMs intact and clear with normal canals Nose:  no deformity, discharge, inflammation, or lesions Mouth:  no deformity or lesions Neck:  no masses, thyromegaly, or abnormal cervical nodes Chest Wall:  severe S shapped scoliosis small chest barrell chest Lungs:  coarse BS throughout and prolonged exhilation.   Heart:  regular rate and rhythm, S1, S2 without rubs, gallops, or clicks.  Murmur NOS present.  Abdomen:  bowel sounds positive; abdomen soft and non-tender without masses, or organomegaly Msk:  no deformity or scoliosis noted with normal posture Pulses:  pulses normal Extremities:  minimal ankle edema Neurologic:  CN II-XII grossly intact with normal reflexes, coordination, muscle strength and tone Skin:  intact without lesions or rashes Cervical Nodes:  no significant adenopathy Axillary Nodes:  no significant adenopathy Psych:  alert and cooperative; normal mood and affect; normal attention span and concentration   Pulmonary Function Test Date: 10/27/2010 Height (in.): 58 Gender: Female  Pre-Spirometry FVC    Value: 1L L/min     % Pred: 49% % FEV1    Value: 0.8L L     % Pred: 50% % FEV1/FVC  Value: 76 %     Comments: severe restriction independently rviewed  Impression & Recommendations:  Problem # 1:  DYSPNEA (ICD-786.05) Assessment Deteriorated She has had progressive dyspnea with worsening hypoxemia since sept 2011. This is despite daily exercises up until sept 2011 and optimal obstructive lung disease Rx. Spirometry today is unchanged from 1 year ago. I am suspecting deconditioning versus angian equivalent versus  pulmonary hypertension due to polip related scolosis. She prefers to be investigatd to rule out different etiologies. she prefers to start with non-invasive approach. She needs cardiology eval.  I d/w Dr. Selena Batten her PMD and he recommends I refer her to Dr Lavella Hammock. I willl see her in 2-3 months. I will get hold of Dr. Jacinto Halim as well Orders: Cardiology Referral (Cardiology) Est. Patient Level IV (95621)  Patient Instructions: 1)  THere is no change in your breathing test 2)  I d/w Dr. Selena Batten and we will refer you to Dr. Shon Baton cardiologist 3)  I will d/w Dr. Jacinto Halim about getting non-invasive tests to the extent possible 4)  We will have to see if the stents are blocked and/or if you have  pulmnary hypertension 5)  Followup in 2-3 months to  give you enough time to finish workup with Dr. Shon Baton 6)  Hold off on [employer] exercises till you see Dr. Shon Baton

## 2010-11-12 NOTE — Progress Notes (Signed)
Summary: check on pt SOB  ---- Converted from flag ---- ---- 08/04/2010 5:53 PM, Kalman Shan MD wrote: callpatient and inquire on dyspnea ------------------------------   Phone Note Outgoing Call   Call placed by: Carron Curie CMA,  September 22, 2010 11:22 AM Summary of Call: spoke with pt and she states she doen not see any difference in her SOB. She states her O2 level stays at 98% with rest but wuith exertion it drops to 89-91%. She states when this happens she can rest and sats increase again to 98%. She states she does not see any difference in the SOB sicne last OV, no better, no worse.  I advised I will forward to MR to make him aware.  Initial call taken by: Carron Curie CMA,  September 22, 2010 11:24 AM     Appended Document: check on pt SOB please have them do cbcd, bmet, bnp at their convenience  Appended Document: check on pt SOB [id number]  Appended Document: check on pt SOB Spoke with pt and she is okay with coming in tommorrow (12/16) for these labs to be drawn.  Orders placed in IDX.

## 2010-11-18 NOTE — Letter (Signed)
Summary: [facility]   Imported By: Lester Murrieta 11/10/2010 10:20:17  _____________________________________________________________________  External Attachment:    Type:   Image     Comment:   External Document

## 2010-11-23 ENCOUNTER — Encounter: Payer: Self-pay | Admitting: Internal Medicine

## 2010-11-27 ENCOUNTER — Encounter: Payer: Self-pay | Admitting: Internal Medicine

## 2010-12-06 ENCOUNTER — Telehealth: Payer: Self-pay | Admitting: Internal Medicine

## 2010-12-17 ENCOUNTER — Encounter: Payer: Self-pay | Admitting: Internal Medicine

## 2010-12-17 ENCOUNTER — Ambulatory Visit (INDEPENDENT_AMBULATORY_CARE_PROVIDER_SITE_OTHER): Payer: Medicare Other | Admitting: Internal Medicine

## 2010-12-17 DIAGNOSIS — R0602 Shortness of breath: Secondary | ICD-10-CM

## 2010-12-17 NOTE — Letter (Signed)
Summary: [facility]  [facility]   Imported By: Sherian Rein 12/09/2010 14:47:57  _____________________________________________________________________  External Attachment:    Type:   Image     Comment:   External Document

## 2010-12-17 NOTE — Progress Notes (Signed)
Summary: needs fu>12-17-10  Phone Note Outgoing Call   Summary of Call: got letter from Dr Shon Baton. Pls give her appt to fu with me Initial call taken by: Kalman Shan MD,  December 06, 2010 9:32 PM  Follow-up for Phone Call        pt set to see MR on 12-17-10 at 11:30am. Carron Curie CMA  December 08, 2010 11:53 AM

## 2010-12-29 NOTE — Assessment & Plan Note (Signed)
Summary: 2 month follow-up//jrc   Visit Type:  Follow-up Copy to:  Dr. Pearson Grippe Primary Provider/Referring Provider:  Dr. Pearson Grippe - Primary, Dr. Tomma Lightning - Cards  CC:  Follow-up in 2 months.  Pt states sh ewas havign some increaed SOB but they realized her o2 tanks was clogged so once that was fixed she has been doing well. Marland Kitchen  History of Present Illness: 77 yo female with dyspnea likely from Hypoxemia, Scoliosis, and OSA and Abn PFT( c/w restriction from scoliosis,  airtrapping ? cause, and low dlco - ? cause - maintained on COPD Rx)    August 04, 2010: Followup viist after nearly a year. COmpleted rehab 1 year ago.  In interim, was admitted to [facility] OCt 2010 for atypical chest pain. Reviw of records shows Left heart cath normal showed patent LAD and RCA stent. No right heart cath performed. [employer] regularly.  Now, has noticed worsening dyspnea last several months even for smiple activities like going across room. O2 upped by husband to 4L based on finger pulse ox probe at home. Husband feels active exercise of polio patient is fatiguging muscles and making her more dyspneic. Denies asspciated chest pain, cough, wheeze, edema, hemoptysius, sputum, nausea, vomit, diarrhea, syncope, orthopnea, pnd. EAV:[employer] exercises, REASSESS OVER PHONE IN 1 MONTH, DO LABS -> BNP 145, Hgb 12.5gm%. ROV 3 months   October 27, 2010: Followup for above. States that since labor day weekend dyspneic with minimal exertion. This is worse than baseline. Husband states something changed around that time. Stopping [employer] exercises have not helped. Dyspnea is progressive. More hypoxemic too. Does hvae occ heart burns that mimic old gerd v CAD. No definite chest pains, worsening edema, syncope, nausea, vomit, diarrhea. She does have intentional weight loss due to diet.  Spirometry today shows no change comparted to past. REC: REFER CARDIOLOGY     March 8. 2012: Followup. Presents with husband. In interim  became more dyspneic but it was due to clogged o2 tank. Now back at recent baseline of last visit. Stil dyspneic. Saw Dr Shon Baton. Nuc med stress test per his chart was normal. ECHO showed severe pulm htn but normal RV function. Dr Shon Baton is wondering if she is a candidate for Revatio or Adcirca Rx . No other new issue. She is thinking of restarting at [facility] Screening-Counseling & Management  Alcohol-Tobacco     Smoking Status: never  Current Medications (verified): 1)  Spiriva Handihaler 18 Mcg Caps (Tiotropium Bromide Monohydrate) .... One Capsule Inhaled Once A Day 2)  Advair Diskus 250-50 Mcg/dose Misc (Fluticasone-Salmeterol) .... One Puff Two Times A Day 3)  Glimepiride 2 Mg Tabs (Glimepiride) .... Take 1 Tablet By Mouth Once A Day 4)  Adult Aspirin Ec Low Strength 81 Mg Tbec (Aspirin) .... Two Tablets A Day 5)  Plavix 75 Mg Tabs (Clopidogrel Bisulfate) .... Take 1 Tablet By Mouth Once A Day 6)  Digoxin 0.25 Mg Tabs (Digoxin) .... Take 1 Tablet By Mouth Once A Day 7)  Diovan 160 Mg Tabs (Valsartan) .... 1/2 Tablet in Am and Pm 8)  Isosorbide Mononitrate Cr 30 Mg Xr24h-Tab (Isosorbide Mononitrate) .... Once Daily 9)  Januvia 100 Mg Tabs (Sitagliptin Phosphate) .... Take 1 Tablet By Mouth Once A Day 10)  Pravastatin Sodium 80 Mg Tabs (Pravastatin Sodium) .... Take 1 Tablet By Mouth Once A Day 11)  Metoprolol Tartrate 50 Mg Tabs (Metoprolol Tartrate) .... Take 1 Tablet By Mouth Once A Day 12)  Nitroglycerin 0.4  Mg Subl (Nitroglycerin) .... As Needed 13)  Protonix 40 Mg Tbec (Pantoprazole Sodium) .... Take 1 Tablet By Mouth Once A Day 14)  Torsemide 20 Mg Tabs (Torsemide) .... Take 1 Tablet By Mouth Once A Day As Needed 15)  Folic Acid 800 Mcg Tabs (Folic Acid) .... Take 1 Tablet By Mouth Once A Day 16)  Glucosamine Sulfate 750 Mg Tabs (Glucosamine Sulfate) .... Take 1 Tablet By Mouth Three Times A Day 17)  Multivitamins  Tabs (Multiple Vitamin) .... Take 1 Tablet By Mouth Once A  Day 18)  Tylenol Pm Extra Strength 500-25 Mg Tabs (Diphenhydramine-Apap (Sleep)) .... 1/2 A Day As Needed Sleep 19)  Propoxyphene N-Apap 100-650 Mg Tabs (Propoxyphene N-Apap) .... Take 1 Tablet By Mouth Once A Day As Needed 20)  Singulair 10 Mg Tabs (Montelukast Sodium) .... Once Daily As Needed 21)  Oxycodone-Acetaminophen 7.5-325 Mg Tabs (Oxycodone-Acetaminophen) .... Once Daily  Allergies (verified): 1)  ! Persantine 2)  ! * Dicloxicilin  Past History:  Past medical, surgical, family and social histories (including risk factors) reviewed, and no changes noted (except as noted below).  Past Medical History: Reviewed history from 10/27/2010 and no changes required. #Diabetes, Type 2 #Hyperlipidemia #Hypertension #Atrial Fibrillation  - intolerant to coumadin in [place] in 2005 #CAD - snce 1990s. Typical symptoms are chest congestion but never chest pain - 2001 Cath at [facility] by Dr Debby Freiberg - Single 95% RCA with normal LVEF - 2003 CHF admit and RCA stent per hx - done in [place] -2005 [facility], [place] - LAD stent placed 80% -> 0. RCA stent patent, 99% small diag lesion - no intervention - 2010 October Cath at [facility] - atypical heart burn after meal. Cath - patent stents #"MIXED RESTRICTIVE-OBSTRUCTIVE PFT ABNORMALITY with DYSPNEA and HYPOXEMIA - Labelled as COPD but never smoked, no childhood asthma, no occ dust exposure.  - 07/14/2007 CXR - scoliosis - ABG 02/13/2009: 7.4/53/53.  - PFTs 02/13/2009 done on copd Rx (spiriva and advair). Cleda Daub shows restriction. Lung volumes suggests air trapping. Diffusion is very severely low. : FVC 1L/50%, FeV1 1L/72%, Ratio 98 (72), Fef 25-75% 1.6L/86%, TLCO 3.6/102%, RV 2.6/177%, RV/TLC 72% (41), DLCO 8.3/46 - Spiro 10/27/2010 - FVC 1L/49%, Fev1 0.8L/50%, rati 76 - no change #Sleep Apnea #Hx of Breast Cancer with Right mastectomy 06/12/99 - negative bone scan 2008 per echart - negative mammogram 08/2008 per echart #Hx of Gi Bleed UGI And LGI bleed and  GERD #Post Polio Syndrome with severe scoliosis - severe scoliosois with progressive loss of ht - chronic back pain - uses stimulator Cataracts# #Labs 02/04/2009: Alb 3.9, Creat 0.6, Glucose 100, BUN 13, AST 7, ALT 9,  HGB A1C 6.6  Past Surgical History: Reviewed history from 02/13/2009 and no changes required. [id number] Bunionectomy Left Elbow surgery Right Mastectomy 2000 Gsllbladder -1980 Appendix-1985  Family History: Reviewed history from 02/12/2009 and no changes required. Father-died at age 52 from MI Mother- bladder Cancer, hypercalcemia  Social History: Reviewed history from 02/12/2009 and no changes required. Married 4 children-all boys Never Smoked No ETOH use  Vital Signs:  Patient profile:   77 year old female Height:      58 inches Weight:      148.13 pounds BMI:     31.07 O2 Sat:      94 % on 3 L/min Temp:     98.0 degrees F oral Pulse rate:   56 / minute BP sitting:   120 / 56  (right arm) Cuff size:   regular  Vitals Entered By: Carron Curie CMA (December 17, 2010 11:32 AM)  O2 Flow:  3 L/min CC: Follow-up in 2 months.  Pt states sh ewas havign some increaed SOB but they realized her o2 tanks was clogged so once that was fixed she has been doing well.  Comments Medications reviewed with patient Carron Curie CMA  December 17, 2010 11:32 AM Daytime phone number verified with patient.    Physical Exam  General:  short woman sitting in wheel chair with o2  Head:  normocephalic and atraumatic Eyes:  PERRLA/EOM intact; conjunctiva and sclera clear Ears:  TMs intact and clear with normal canals Nose:  no deformity, discharge, inflammation, or lesions Mouth:  no deformity or lesions Neck:  no masses, thyromegaly, or abnormal cervical nodes Chest Wall:  severe S shapped scoliosis small chest barrell chest Lungs:  coarse BS throughout and prolonged exhilation.   Heart:  regular rate and rhythm, S1, S2 without rubs, gallops, or clicks. Murmur NOS  present.  Abdomen:  bowel sounds positive; abdomen soft and non-tender without masses, or organomegaly Msk:  no deformity or scoliosis noted with normal posture Pulses:  pulses normal Extremities:  minimal ankle edema Neurologic:  CN II-XII grossly intact with normal reflexes, coordination, muscle strength and tone Skin:  intact without lesions or rashes Cervical Nodes:  no significant adenopathy Axillary Nodes:  no significant adenopathy Psych:  alert and cooperative; normal mood and affect; normal attention span and concentration   Impression & Recommendations:  Problem # 1:  DYSPNEA (ICD-786.05) Assessment Unchanged  She has had progressive dyspnea with worsening hypoxemia since sept 2011. This is despite daily exercises up until sept 2011 and optimal Rx obstructive lung disease Rx. Spirometry Jan 2012 is unchanged from 1 year ago. She saw Dr. Shon Baton. Nuclear medicine stress test is normal. ECHO shows pulm htn with normal RV. Therefore, I suspect that pulm htn, obesity, kyphosis, deconditoning are responsible for worsening dyspnea. We discussed revatio Rx but there is no evidence this works in [employer] due to [employer]. Moreover, she is on nitrates. Therefore, will hold off. Encouraged active physical exercises at [facility] and hope for the best. I will see her in 6-8 months and if she is dcomepnsating we could try revatio.  She agrees  Orders: Est. Patient Level III [phone])  Patient Instructions: 1)  glad you are doing okay 2)  I am not keen with revatio treatment esp in presence of taking imdur 3)   - moreover I am not sure it will help 4)  If you get worse or feel like you want to try it let me know and we could potentially give it a shot but will need clearance from DR Ganjii esp with being on imdur 5)  REturn in 8 months to 6 months

## 2011-01-13 ENCOUNTER — Encounter: Payer: Self-pay | Admitting: Internal Medicine

## 2011-01-14 LAB — GLUCOSE, CAPILLARY
Glucose-Capillary: 127 mg/dL — ABNORMAL HIGH (ref 70–99)
Glucose-Capillary: 132 mg/dL — ABNORMAL HIGH (ref 70–99)
Glucose-Capillary: 134 mg/dL — ABNORMAL HIGH (ref 70–99)
Glucose-Capillary: 146 mg/dL — ABNORMAL HIGH (ref 70–99)
Glucose-Capillary: 150 mg/dL — ABNORMAL HIGH (ref 70–99)
Glucose-Capillary: 159 mg/dL — ABNORMAL HIGH (ref 70–99)
Glucose-Capillary: 170 mg/dL — ABNORMAL HIGH (ref 70–99)
Glucose-Capillary: 173 mg/dL — ABNORMAL HIGH (ref 70–99)

## 2011-01-14 LAB — CARDIAC PANEL(CRET KIN+CKTOT+MB+TROPI)
Relative Index: INVALID (ref 0.0–2.5)
Total CK: 21 U/L (ref 7–177)
Total CK: 29 U/L (ref 7–177)
Troponin I: 0.02 ng/mL (ref 0.00–0.06)

## 2011-01-14 LAB — COMPREHENSIVE METABOLIC PANEL
ALT: 15 U/L (ref 0–35)
ALT: 16 U/L (ref 0–35)
AST: 12 U/L (ref 0–37)
AST: 15 U/L (ref 0–37)
Albumin: 3.3 g/dL — ABNORMAL LOW (ref 3.5–5.2)
Calcium: 10.3 mg/dL (ref 8.4–10.5)
Calcium: 10.3 mg/dL (ref 8.4–10.5)
Creatinine, Ser: 0.7 mg/dL (ref 0.4–1.2)
Creatinine, Ser: 0.73 mg/dL (ref 0.4–1.2)
GFR calc Af Amer: >60 mL/min (ref >60)
GFR calc Af Amer: >60 mL/min (ref >60)
GFR calc non Af Amer: >60 mL/min (ref >60)
GFR calc non Af Amer: >60 mL/min (ref >60)
Sodium: 141 mEq/L (ref 135–145)
Sodium: 142 mEq/L (ref 135–145)
Total Protein: 5.7 g/dL — ABNORMAL LOW (ref 6.0–8.3)
Total Protein: 5.9 g/dL — ABNORMAL LOW (ref 6.0–8.3)

## 2011-01-14 LAB — POCT CARDIAC MARKERS
CKMB, poc: 1.1 ng/mL (ref 1.0–8.0)
Troponin i, poc: <0.05 ng/mL (ref 0.00–0.09)

## 2011-01-14 LAB — CBC
Hemoglobin: 14.2 g/dL (ref 12.0–15.0)
Hemoglobin: 14.9 g/dL (ref 12.0–15.0)
MCHC: 33.2 g/dL (ref 30.0–36.0)
MCV: 96.4 fL (ref 78.0–100.0)
MCV: 97.6 fL (ref 78.0–100.0)
Platelets: 160 10*3/uL (ref 150–400)
Platelets: 201 10*3/uL (ref 150–400)
Platelets: 206 10*3/uL (ref 150–400)
RBC: 4.31 MIL/uL (ref 3.87–5.11)
RBC: 4.65 MIL/uL (ref 3.87–5.11)
RDW: 13.5 % (ref 11.5–15.5)
RDW: 13.7 % (ref 11.5–15.5)
WBC: 10.6 10*3/uL — ABNORMAL HIGH (ref 4.0–10.5)

## 2011-01-14 LAB — HEMOGLOBIN A1C
Hgb A1c MFr Bld: 6.9 % — ABNORMAL HIGH (ref 4.6–6.1)
Mean Plasma Glucose: 151 mg/dL

## 2011-01-14 LAB — DIFFERENTIAL
Eosinophils Absolute: 0.2 10*3/uL (ref 0.0–0.7)
Eosinophils Absolute: 0.2 10*3/uL (ref 0.0–0.7)
Eosinophils Relative: 2 % (ref 0–5)
Eosinophils Relative: 2 % (ref 0–5)
Lymphocytes Relative: 13 % (ref 12–46)
Lymphocytes Relative: 16 % (ref 12–46)
Lymphs Abs: 1.3 10*3/uL (ref 0.7–4.0)
Lymphs Abs: 1.4 10*3/uL (ref 0.7–4.0)
Monocytes Absolute: 0.8 10*3/uL (ref 0.1–1.0)
Monocytes Relative: 9 % (ref 3–12)
Monocytes Relative: 9 % (ref 3–12)
Neutrophils Relative %: 76 % (ref 43–77)

## 2011-01-14 LAB — POCT I-STAT, CHEM 8
BUN: 21 mg/dL (ref 6–23)
Calcium, Ion: 1.38 mmol/L — ABNORMAL HIGH (ref 1.12–1.32)
TCO2: 33 mmol/L (ref 0–100)

## 2011-01-14 LAB — LIPID PANEL
Cholesterol: 157 mg/dL (ref 0–200)
HDL: 43 mg/dL (ref >39)
Total CHOL/HDL Ratio: 3.7 RATIO
VLDL: 32 mg/dL (ref 0–40)

## 2011-01-14 LAB — BASIC METABOLIC PANEL
BUN: 9 mg/dL (ref 6–23)
Calcium: 10.3 mg/dL (ref 8.4–10.5)
Calcium: 9.5 mg/dL (ref 8.4–10.5)
Chloride: 95 mEq/L — ABNORMAL LOW (ref 96–112)
Creatinine, Ser: 0.71 mg/dL (ref 0.4–1.2)
GFR calc Af Amer: >60 mL/min (ref >60)
GFR calc non Af Amer: >60 mL/min (ref >60)
Glucose, Bld: 173 mg/dL — ABNORMAL HIGH (ref 70–99)
Sodium: 135 mEq/L (ref 135–145)

## 2011-01-14 LAB — CK TOTAL AND CKMB (NOT AT ARMC)
Relative Index: INVALID (ref 0.0–2.5)
Total CK: 14 U/L (ref 7–177)

## 2011-01-14 LAB — PROTIME-INR
INR: 1.02 (ref 0.00–1.49)
Prothrombin Time: 13.3 seconds (ref 11.6–15.2)

## 2011-01-19 LAB — BLOOD GAS, ARTERIAL
Acid-Base Excess: 7.6 mmol/L — ABNORMAL HIGH (ref 0.0–2.0)
Bicarbonate: 33.2 mEq/L — ABNORMAL HIGH (ref 20.0–24.0)
Drawn by: 211791
TCO2: 29.7 mmol/L (ref 0–100)
pCO2 arterial: 52.7 mmHg — ABNORMAL HIGH (ref 35.0–45.0)
pO2, Arterial: 53.4 mmHg — ABNORMAL LOW (ref 80.0–100.0)

## 2011-02-26 NOTE — Discharge Summary (Signed)
NAME:  Meghan Casey, Meghan Casey               ACCOUNT NO.:  [id number]   MEDICAL RECORD NO.:  [id number]          PATIENT TYPE:  INP   LOCATION:  3731                         FACILITY:  [facility]   PHYSICIAN:  Mobolaji B. Bakare, M.D.DATE OF BIRTH:  08-Mar-1934   DATE OF ADMISSION:  03/13/2005  DATE OF DISCHARGE:  03/16/2005                                 DISCHARGE SUMMARY   FINAL DIAGNOSIS:  Hematochezia, moderate degree of diverticulosis involving  sigmoid and left colon.   SECONDARY DIAGNOSIS:  1.  Type 2 diabetes mellitus.  2.  Hypertension.  3.  Hyperlipidemia.  4.  History of coronary artery disease.  5.  Kyphoscoliosis with cor pulmonale on home oxygen.   CONSULTATIONS:  Georgiana Spinner, M.D.   PROCEDURE:  1.  Upper endoscopy done by Dr. Virginia Rochester which showed large hiatal hernia.  2.  Colonoscopy which showed moderate degree of diverticulosis involving      sigmoid and left colon without active bleeding at the time of      evaluation.  There were internal hemorrhoids.   CHIEF COMPLAINT:  Bright red blood per rectum.   BRIEF HISTORY:  Meghan Casey is a 77 year old Caucasian with multiple medical  problems.  She started noticing bright red blood per rectum the day prior to  hospitalization.  She had four episodes at home and three episodes at the  emergency department.  There was no accompanying dizziness, shortness of  breath, palpitations, or chest pain.  She was hemodynamically stable,  hemoglobin was 13, and she was admitted for further evaluation.   PHYSICAL FINDINGS:  On admission, blood pressure 158/56, pulse rate 76,  respiratory rate 20, temperature 98.3, O2 saturation 92% on room air.  She  was not clinically pale.  She had kyphoscoliosis.  Good air entry on lung  examination.  Abdomen was soft without tenderness and bowel sounds were  present.  The rest of the physical examination was unremarkable.   LABORATORY DATA:  White cells 6.9, hemoglobin 11.7,  hematocrit 36,  platelets 206.   This was at discharge.  Coags were within normal limits.   HOSPITAL COURSE:  Meghan Casey was admitted for stabilization.  She remained  hemodynamically stable during the course of hospitalization and hemoglobin  did not drop significantly.  She did not require any product transfusion  during this hospitalization.  EGD and colonoscopy report were as noted.  It  was concluded that she probably bled from diverticulosis and at the time of  colonoscopy, this was not active.  She was able to ambulate and it was felt  that she could be discharged home.   DISCHARGE MEDICATIONS:  Lipitor 20 mg daily, Amaryl as before, Lasix 40 mg  daily, Metoprolol 50 mg t.i.d., Protonix 40 mg daily.  The patient was  instructed to resume aspirin 162 mg on Friday, March 19, 2005, (to hold for  one week), Plavix 75 mg daily, Advair 500/50 one puff b.i.d., Spiriva 18 mcg  once daily, Diovan to use as before, Imdur to use as before.   DISCHARGE INSTRUCTIONS:  Follow up with Dr. Lendell Caprice in one  week.  Diet:  Diabetic diet and the patient was instructed to avoid seedy foods.       MBB/MEDQ  D:  03/26/2005  T:  03/26/2005  Job:  [id number]   cc:   Janae Bridgeman. Eloise Harman., M.D.  [address]  [place]  [place] [address]  Fax: [phone]   Antionette Char, MD  [address]  [place]  [place] [address]  Fax: [phone]   Jordan Hawks. Elnoria Howard, MD  Fax: [phone]

## 2011-02-26 NOTE — H&P (Signed)
[place]  Patient:    Meghan Casey, Meghan Casey                      MRN: [id number] Adm. Date:  [id number] Attending:  Silvestre Mesi Dictator:   Donzetta Matters, P.A.                         History and Physical  DATE OF BIRTH:  1934/02/08  CHIEF COMPLAINT:  Chest pain.  HISTORY OF PRESENT ILLNESS:  This is a 77 year old female that gives history of chest pain off and on for several months, getting worse, with mid chest discomfort, sometimes radiates to left upper chest, also radiates to back. She does have shortness of breath, nausea, no diaphoresis, seems constant but can get severe, gets worse with exertion and any activities of daily living. She does use nitroglycerin with relief, and has been using nitroglycerin daily.  Resting appears to reduce the pain.  Lying on her back makes the pain worse.  She did have a catheterization in [place] in 1997 with a 50% lesion in the mid LAD.  OTHER MEDICAL HISTORY:  Breast cancer, fracture of the right eighth rib, angina pectoris for many years, mitral valve prolapse, kyphoscoliosis, history of polio, hiatal hernia, hypertension, cor pulmonale, osteoarthritis, history of non-insulin-dependent diabetes, history of phlebitis, and coronary artery disease.  SOCIAL HISTORY:  Bunions removed from her feet, left elbow surgery six years ago, mastectomy and followed by Dr. Catha Gosselin, hysterectomy, appendectomy, cholecystectomy, heart catheterization x 2.  DRUG ALLERGIES:  DICLOXACILLIN.  MEDICATIONS:  1. Metoprolol 50 mg p.o. b.i.d.  2. Diovan 160 mg p.o. q.d.  3. Ranitidine 300 mg daily.  4. Claritin 10 mg daily.  5. ______ 2.5 mg p.o. q.h.s.  6. Clonidine 0.1 mg q.h.s.  7. Aspirin 81 mg q.d.  8. Folic acid 800 mcg q.d.  9. Glucosamine chondroitin b.i.d. 10. Multivitamin daily. 11. Vitamin E daily. 12. Imdur 60 mg b.i.d. 13. Aleve 200 mg q.d.  FAMILY HISTORY:  Mother died at age 90 of cancer and renal  failure.  Father died at age 103 of an MI x 2.  Three sisters with heart problems, and one brother with heart problems.  SOCIAL HISTORY:  She is married, retired as a housewife, nonsmoker, denies alcohol use, positive caffeine use, denies any drug abuse.  REVIEW OF SYSTEMS:  Denies any fever, chills.  Does have some night sweats. Weight has increased some, has some edema of her feet, does sleep fair at night.  Does wear glasses, does have small cataracts.  Does have some ringing in her ears.  Does have some running of her nose, especially with eating.  She does sneeze a lot.  She has partial dentures on the lower.  Cardiovascular reveals chest pain as noted in HPI.  Does have some dyspnea with chest pain. Positive fatigue always.  Denies any orthopnea.  Does have claudication, which she feels is related to a post polio syndrome.  Does cough, usually has clear white sputum, some congestion.  Does have some nausea and vomiting.  Heart burn is relieved with diet.  Has some diarrhea.  Denies any nocturia.  Is post hysterectomy.  Does have musculoskeletal pain in her hips, myalgias in her legs, with weakness in her legs.  She is unsteady on her feet and does use a wheelchair at times.  Has rash with the sun and does have rashes on  her feet. Does have some lightheadedness with the chest pain, some right arm numbness was also noted.  Denies any dizziness or syncope.  States she does have some depression.  Denies any anorexia.  Does have history of diabetes, no excessive hunger, no excessive thirst.  Does have some easy bruising.  DRUG ALLERGIES:  DICLOXACILLIN.  NONDRUG ALLERGIES:  WHOLE MILK PRODUCTS, PEANUTS, and ACID FOODS.  PHYSICAL EXAMINATION:  VITAL SIGNS:  Afebrile, pulse 64, respirations 18, blood pressure 164/88, weight 190.  GENERAL:  Well-developed, well-nourished white female at present time in no acute distress.  She is somewhat obese.  HEENT:  Pupils are equal.   Extraocular movements intact.  Oropharynx is benign with dentures on her upper.  NECK:  Supple, midline trachea.  No JVD, no bruits, no thyromegaly.  CHEST:  Clear to auscultation and percussion.  BREASTS:  Normal contour.  Right mastectomy.  No discharge or tenderness.  No cervical adenopathy.  HEART:  Regular rate and rhythm, normal S1 and S2, without murmurs, rubs, clicks, or gallops heard.  ABDOMEN:  Obese, nontender, not distended.  Post hysterectomy.  Nontender over the bladder.  EXTREMITIES:  Does have some positive foot swelling.  Strength is 5/5 both upper and lower extremities.  Moves both upper and lower extremities without any difficulty.  SKIN:  Warm and dry without jaundice, cyanosis, pallor, or rashes.  She has brisk capillary refill.  NEUROLOGIC:  She is oriented to person, place, time, and situation.  Cranial nerves 2-12 are grossly intact.  LABORATORY DATA:  Chest x-ray shows large hiatal hernia.  Lung fields that are visualized are clear.  Laboratory includes pro time 13.0, INR 1.0, PTT 24.  CMET shows sodium 144, potassium 4.1, chloride 104, glucose 124, CO2 33, BUN 25, creatinine 0.8, albumin 3.4.  CBC shows white count at 7800, hemoglobin 14.5, hematocrit 43.3, platelet count 210.  EKG shows normal sinus rhythm with nonspecific T wave abnormalities, rate is 64.  IMPRESSION: 1. Coronary artery disease. 2. Angina. 3. Hypertension. 4. Mitral valve prolapse. 5. Cor pulmonale.  PLAN:  Elective heart catheterization with disposition as indicated by test. DD:  07/20/00 TD:  07/20/00 Job: [id number]

## 2011-02-26 NOTE — Op Note (Signed)
Meghan Casey, Meghan Casey NO.:  [id number]   MEDICAL RECORD NO.:  [id number]          PATIENT TYPE:  INP   LOCATION:  3731                         FACILITY:  [facility]   PHYSICIAN:  Georgiana Spinner, M.D.    DATE OF BIRTH:  06-Apr-1934   DATE OF PROCEDURE:  03/15/2005  DATE OF DISCHARGE:                                 OPERATIVE REPORT   PROCEDURE:  Colonoscopy.   INDICATIONS FOR PROCEDURE:  Rectal bleeding.   ANESTHESIA:  None further given.   DESCRIPTION OF PROCEDURE:  With the patient mildly sedated in the left  lateral decubitus position, the Olympus videoscopic colonoscope was inserted  in the rectum, passed under direct vision to the cecum, identified by  ileocecal valve and appendiceal orifice, both of which are photographed.  From this point, the colonoscope was slowly withdrawn taking circumferential  views of the colonic mucosa, stopping only in the rectum which appeared  normal in direct and showed hemorrhoids on retroflexed view.  The endoscope  was straightened and withdrawn.  The patient's vital signs and pulse  oximetry remained stable.  The patient tolerated the procedure well without  apparent complications.   FINDINGS:  Moderate degree of diverticulosis of the sigmoid and left colon.  Otherwise, unremarkable examination other than internal hemorrhoids.  No  active bleeding at this time, presumed the bleeding came from the  diverticula, but since the patient is stable, would advance diet and follow  clinically with attempts to discharge in the near future if the patient  remains stable with outpatient follow-up with Corinna L. Lendell Caprice, M.D.      GMO/MEDQ  D:  03/15/2005  T:  03/15/2005  Job:  [id number]

## 2011-02-26 NOTE — Cardiovascular Report (Signed)
[place]. [facility]  Patient:    Meghan Casey, Meghan Casey                      MRN: [id number] Proc. Date: 07/20/00 Adm. Date:  [id number] Attending:  Silvestre Mesi CC:         Jaclyn Prime. Lucas Mallow, M.D.  John R. Aleen Campi, M.D.  Cardiac Catheterization Laboratory   Cardiac Catheterization  REFERRING PHYSICIAN:  Jaclyn Prime. Lucas Mallow, M.D.  PROCEDURES PERFORMED: 1.  Left heart catheterization. 2.  Coronary cineangiography. 3.  Left ventricular cineangiography. 4.  Abdominal aortogram. 5.  Angioplasty with primary stenting of the proximal right coronary     artery. 6.  Perclose of the right femoral artery and right femoral vein.  INDICATIONS:  This 77 year old female has a long history of severe hypertension and has had chest pain off and on since 1992.  She first had a heart catheterization in 1992 and again in 1997 in another city.  The results of these catheterizations are unavailable.  She now presents with unstable angina and evidence for myocardial ischemia.  DESCRIPTION OF PROCEDURE:  After signing an informed consent, the patient was premedicated with 50 mg of Benadryl intravenously and brought to the cardiac catheterization laboratory.  Her right groin was prepped and draped in a sterile fashion and anesthetized locally with 1% Lidocaine.  A 6-French introducer sheath was inserted percutaneously into the right femoral artery. Six [place] #4 Judkins coronary catheters were used to make injections into the native coronary arteries.  A 6-French pigtail catheter was used to measure pressures in the left ventricle and aorta and to make midstream injections into the left ventricle and abdominal aorta.  After noting severe single-vessel coronary artery disease with a critical stenosis in her proximal right coronary artery, we elected to proceed with an angioplasty procedure.  We first inserted a 6-French JR-4 guide catheter, which was advanced to the root of the  aorta.  Injections were again made into the right coronary artery. We then selected a high-torque floppy guide wire, which was advanced through the guide wire into the right coronary artery and with moderate difficulty was passed through the critical stenosis in the proximal right coronary artery and positioned in the distal segment of the vessel.  We then selected a multilink Penta 3.0 x 15 mm stent deployment system and after proper preparation this was inserted over the guide wire and advanced into the right coronary artery. After moderate difficulty, it was advanced through the stenotic lesion and positioned within the lesion.  The stent was then deployed with one inflation at 16 atm x 44 seconds.  After the stent was deployed, the deployment balloon was removed and injection again into the right coronary artery showed an excellent angiographic result with 0% residual stenosis and normal antegrade flow.  There was no evidence for clotting or dissection.  The patient tolerated the procedure well and no complications were noted.  At the end of the procedure, the catheters and sheaths were removed from the right femoral vessels and hemostasis was easily obtained with a Perclose closure system in both the artery and the vein.  Venous access was obtained during the angioplasty procedure with a 6-French sheath in the right femoral vein.  The patient was then admitted to the EAU for further monitoring and we will anticipate discharge in the a.m.  MEDICATIONS GIVEN:  Metoprolol 5 mg IV x 3, nitroglycerin 200 units intracoronary, Integrilin by pharmacy protocol, heparin 6500  units IV, Versed 1 mg IV.  HEMODYNAMIC DATA:  Left ventricular pressure 217/0-22, aortic pressure 217/107 with a mean of 149.  Left ventricular ejection fraction was measured at 54%.  CINE FINDINGS:  CORONARY CINEANGIOGRAPHY: 1.  Left coronary artery:  The ostium and left main appear normal.  2.  Left anterior  descending artery:  The proximal segment appears normal.     There is a mild narrowing of less than 20% in the middle segment after     the takeoff of the first large diagonal branch. The remainder of the LAD     appears normal.  There is a large intermediate branch which appears     normal.  3.  Circumflex coronary artery:  This is a small vessel and appears normal.  4.  Right coronary artery:  The right coronary artery is a large dominant     vessel supplying the posterior descending and posterolateral circulation     and portions of the circumflex circulation.  The ostium appears normal.     There is a focal concentric 95% stenosis in the proximal segment just     proximal to the takeoff of the large right ventricular branch.  There     is a segmental narrowing just prior to this critical stenosis of     approximately 20% to 30%.  The remainder of the right coronary artery     appears normal.  LEFT VENTRICULAR CINEANGIOGRAM:  The left ventricular chamber size and contractility appear normal.  The mitral and aortic valves appear normal.  ABDOMINAL AORTOGRAM:  The abdominal aorta is markedly tortuous which follows the severe scoliosis of her thoracic and abdominal spine.  Otherwise, the abdominal aorta is smooth without significant plaque and the renal arteries are normal.  ANGIOPLASTY CINES:  Cines taken during the angioplasty procedure showed proper positioning of the guide wire and stent system.  Final injections in the right coronary artery show an excellent angiographic result with 0% residual lesion and no evidence for dissection or clot.  There was normal antegrade flow.  FINAL DIAGNOSES: 1.  Single-vessel coronary artery disease with 95% right coronary artery     lesion. 2.  Normal left ventricular function. 3.  Normal mitral and aortic valves. 4.  Severe scoliosis of the thoracic and abdominal spine.  5.  Markedly tortuous aorta, conforming to her scoliosis but  without     significant plaque and with normal renal arteries. 6.  Successful angioplasty with primary stenting of the right coronary artery. 7.  Successful Perclose of the right femoral artery and right femoral vein.  DISPOSITION:  We will monitor in the EAU while continuing the Integrilin drip and plan for discharge in the a.m. DD:  07/20/00 TD:  07/20/00 Job: [id number]

## 2011-02-26 NOTE — Discharge Summary (Signed)
[place]. [facility]  Patient:    Meghan Casey, Meghan Casey                      MRN: [id number] Adm. Date:  [id number] Disc. Date: [id number] Attending:  Silvestre Mesi Dictator:   Donzetta Matters, P.A.-C.                           Discharge Summary  DATE OF BIRTH:  Aug 26, 1934  PRINCIPAL DISCHARGE DIAGNOSES: 1. Coronary artery disease with stent placed. 2. Kyphoscoliosis. 3. Large hiatal hernia.  PROCEDURE:  During the patients hospitalization, she underwent a left heart catheterization with stent placement in RCA lesion.  CONSULTATIONS:  None.  COMPLICATIONS:  None.  CONDITION ON DISCHARGE:  Stable.  BRIEF HISTORY:  This is a 77 year old female that was admitted electively through short-stay for heart catheterization after having chest pain.  Labs reviewed and are stable.  Procedure did show single-vessel coronary artery disease with 95% RCA lesion, normal left ventricular function, normal mitral and aortic valves, severe scoliosis of the thoracic and abdominal spine, marked tortuous aorta but within normal, and normal renal arteries.  She underwent a successful angioplasty with stenting of the RCA with successful perclose of the right femoral artery and right femoral vein.  She was monitored overnight, did ambulate without any difficulty.  Vitals were stable the next day with temperature at 98.2, blood pressure 156/78, pulse 78, respirations 18.  Monitor showed sinus rhythm at 70s.  PHYSICAL EXAMINATION:  CHEST:  Clear.  HEART:  Regular rate and rhythm.  No murmur.  ABDOMEN:  Soft and active bowel sounds.  Right groin with Perclose dressing. Bruising is noted to the pubic area on the right which is soft with no hematoma.  PLAN:  She is to be discharged today to home.  DISCHARGE MEDICATIONS:  1. Continue on Plavix that was started in the hospital 75 mg daily.  2. Decrease Imdur 60 mg to once a day.  3. She will be started on Lipitor 20 mg  daily, and samples were given as well     as prescription.  Other medications will remain the same which include:  4. Diovan 60 mg p.o. daily.  5. Ranitidine 300 mg daily.  6. Claritin 10 mg daily.  7. Femara 2.5 mg at bedtime.  8. Klonodine 0.1 mg at bedtime.  9. Aspirin 81 mg in the p.m. 10. Folic acid 800 mcg daily. 11. Glucosamine daily. 12. Multivitamin daily. 13. Vitamin E daily. 14. Imdur 60 mg once daily. 15. Aleve 20 mg twice daily.  ACTIVITY:  As tolerated.  DIET: Low cholesterol.  WOUND CARE:  Watch groins for bleeding.  FOLLOW-UP:  She is to have a follow-up appointment.  She does have one with Dr. Lucas Mallow on August 11, 2000.  She will keep that.  She will be called regarding scheduling cholesterol level checks at the office. DD:  07/21/00 TD:  07/23/00 Job: [id number]

## 2011-02-26 NOTE — Consult Note (Signed)
NAME:  Meghan Casey, Meghan Casey               ACCOUNT NO.:  [id number]   MEDICAL RECORD NO.:  [id number]          PATIENT TYPE:  INP   LOCATION:  3731                         FACILITY:  [facility]   PHYSICIAN:  Jordan Hawks. Elnoria Howard, MD    DATE OF BIRTH:  [birth year]   DATE OF CONSULTATION:  03/14/2005  DATE OF DISCHARGE:                                   CONSULTATION   REASON FOR CONSULTATION:  Hematochezia.   REFERRING PHYSICIAN:  Mallory Shirk, MD.   HISTORY OF PRESENT ILLNESS:  This is a 77 year old white female with a past  medical history of diabetes, hypertension, hyperlipidemia, asthma, coronary  artery disease, status post stent placement and gastroesophageal reflux  disease who presents to the emergency room with multiple bouts of  hematochezia.  The patient states that in the morning on March 13, 2004, she  awoke and had a bowel movement and noted to have painless hematochezia.  She  had multiple bowel movements and it continued to persist.  As the day  progressed, there was less stool and more blood with clots.  She denies  having any prior history of hematochezia.  She denies having any prior  colonoscopies in the past, no family history of colon cancers.  No reports  of any nausea, vomiting, diarrhea or abdominal pain, peptic ulcer disease or  abdominal aortic surgery in the past.  Because of the persistence of her  symptoms, she presented to the emergency room and was noted to be anemic at  approximately 11.7 from a drop of 13.   PAST MEDICAL HISTORY:  Past medial history and past surgical history as  stated above.  Breast cancer status post right mastectomy and  kyphoscoliosis.   ALLERGIES:  DOXYCYCLINE AND PERSANTINE.   SOCIAL HISTORY:  The patient is married and lives with her husband.  Is  retired.   FAMILY HISTORY:  Significant for coronary artery disease, diabetes,  myocardial infarction, hypertension, hypothyroidism.   REVIEW OF SYSTEMS:  As stated above.  Negative for any  dysuria, dysarthria,  headaches, dizziness, chest pain, shortness of breath, positive for  arthritis, no arthralgias, myalgias.   MEDICATIONS:  1.  Amaryl.  2.  Metroprolol.  3.  Lipitor.  4.  Lasix.  5.  Protonix.  6.  Aspirin.  7.  Plavix.  8.  Advair.   PHYSICAL EXAMINATION:  VITAL SIGNS:  Blood pressure is 121/61, heart rate  65, respiratory rate 20, temperature 98.6, pulse ox is 100%.  GENERAL:  The patient is in no acute distress, alert and oriented.  HEENT:  Normocephalic and atraumatic.  Extraocular movements are intact.  Pupils equal, round and reactive to light.  NECK:  Supple, no lymphadenopathy.  LUNGS:  Clear to auscultation bilaterally.  CARDIOVASCULAR:  Regular rate and rhythm without murmurs, rubs or gallops.  ABDOMEN:  Obese, soft, nontender, nondistended, positive bowel sounds.  EXTREMITIES:  No cyanosis, clubbing or edema.  RECTAL:  Examination is positive for gross blood.  No discernible masses;  however, is a possibility of a small  rectal polyp.   IMPRESSION:  1.  Painless hematochezia.  2.  Hypertension.  3.  Diabetes.  4.  Arthritis.  5.  Gastroesophageal reflux disease.  6.  Hyperlipidemia.  7.  Coronary artery disease.  8.  History of breast cancer.   The patient is noted to have painless hematochezia.  The most likely cause  of her painless hematochezia is a diverticular source, given her age and  amount of blood loss at this time.  Other considerations are arteriovenous  malformations, malignancy and the possibility of a rapid upper  gastrointestinal bleed.  However, this is unlikely given her relatively  asymptomatic state from an upper GI standpoint.   PLAN:  1.  Plan at this time is to perform a colonoscopy, plus or minus      esophagogastroduodenoscopy pending the results of the colonoscopy by Dr.      Virginia Rochester in the morning.  2.  Start GoLYTELY at 4-6 liters until her bowel movements are clear.  3.  N.p.o. after midnight except for  meds.      PDH/MEDQ  D:  03/14/2005  T:  03/15/2005  Job:  [id number]   cc:   Mallory Shirk, MD   Georgiana Spinner, M.D.  [address]  [place]  [place] [address]  Fax: [phone]

## 2011-02-26 NOTE — Op Note (Signed)
NAMEJAMILIA, JACQUES NO.:  [id number]   MEDICAL RECORD NO.:  [id number]          PATIENT TYPE:  INP   LOCATION:  3731                         FACILITY:  [facility]   PHYSICIAN:  Georgiana Spinner, M.D.    DATE OF BIRTH:  Apr 18, 1934   DATE OF PROCEDURE:  03/15/2005  DATE OF DISCHARGE:                                 OPERATIVE REPORT   PROCEDURE:  Upper endoscopy.   INDICATIONS FOR PROCEDURE:  GERD.  The patient with acute GI bleeding as  well.   ANESTHESIA:  Demerol 50, Versed 5 mg.   DESCRIPTION OF PROCEDURE:  With the patient mildly sedated in the left  lateral decubitus position, the Olympus videoscopic endoscope was inserted  in the mouth, passed under direct vision through the esophagus which made a  sharp turn to the left as the esophagus enters into the stomach.  The  patient has a large hiatal hernia with most of the stomach up into the  chest, but we were able to traverse the stomach to the antrum which appeared  normal.  The patient started coughing when we tried to intubate the duodenal  bulb, therefore, I terminated the procedure at this point by placing the  endoscope in retroflexed view to view the stomach from below.  The endoscope  was then straightened and withdrawn taking circumferential views of the  remaining gastric and esophageal mucosa.  The patient's vital signs and  pulse oximetry remained stable.  The patient tolerated the procedure well  without apparent complications.   FINDINGS:  Large hiatal hernia, otherwise unremarkable examination.   PLAN:  Proceed to colonoscopy.      GMO/MEDQ  D:  03/15/2005  T:  03/15/2005  Job:  [id number]

## 2011-02-26 NOTE — H&P (Signed)
NAME:  Meghan Casey, Meghan Casey NO.:  [id number]   MEDICAL RECORD NO.:  [id number]          PATIENT TYPE:  EMS   LOCATION:  MAJO                         FACILITY:  [facility]   PHYSICIAN:  Mallory Shirk, MD     DATE OF BIRTH:  Nov 26, 1933   DATE OF ADMISSION:  03/13/2005  DATE OF DISCHARGE:                                HISTORY & PHYSICAL   CHIEF COMPLAINT:  Bright red blood per rectum, four episodes at home, three  episodes after arrival in the ER.   HISTORY OF PRESENT ILLNESS:  Ms. Forni is a very pleasant 77 year old  Caucasian woman with a history of coronary artery disease, polio with  significant kyphoscoliosis, hypertension, and diabetes mellitus, presented  to the emergency department of [facility], on March 13, 2005, with  complaints of bright red blood per rectum.  The patient is completely  asymptomatic.  This a.m. when she passed stool she noticed that it was mixed  with bright red blood.  She had two episodes.  Subsequently in the evening  she had another episode of bright red blood per rectum at which time she  came to the emergency department.  After coming to the emergency department,  she had three stools that were tinged with bright red blood.  The patient  has not had a colonoscopy in over 20 years, however, she has been told at a  prior sigmoidoscopy exam that she has hemorrhoids.  She also has been told  she had diverticulosis/diverticulitis (unclear which one).  The patient  denies any dizziness or shortness of breath, palpitations, or any other  symptoms.  She felt mild abdominal cramping, but she said that was really  mild and not of any significance.  She came in only because she saw the  bright red blood.  Of note, she ate some peanut butter candy the night  before, and she has been told she is allergic to peanut butter a long time  ago but it has never been clearly established as to what that does to her.  The patient has no other complaints at  the present time.   PAST MEDICAL HISTORY:  1.  Kyphoscoliosis, status post polio.  2.  Coronary artery disease, status post PTCA.  3.  Diabetes mellitus, type 2.  4.  Hyperlipidemia.  5.  Hypertension.  6.  Hiatal hernia.  7.  Osteoarthritis.  8.  Cor pulmonale.  9.  Breast CA, status post right radical mastectomy done, by Dr. Cyndia Bent, five years ago.   MEDICATIONS ON ADMISSION:  1.  Amaryl, dose unclear.  2.  Lasix 40 mg p.o. every day.  3.  Metoprolol 50 mg p.o. b.i.d.  4.  Lipitor 20 mg p.o. every day.  5.  Protonix 40 mg p.o. every day.  6.  Aspirin 162 mg p.o. every day.  7.  Plavix 75 mg p.o. every day.  8.  Advair 500/50, one puff b.i.d.  9.  Spiriva 18 mcg one capsule every day inhaled.   ALLERGIES:  1.  DOXYCYCLINE.  2.  PERSANTINE.   FAMILY HISTORY:  Significant for diabetes mellitus, coronary artery disease,  and an MI at age 44 in father.  Hypertension and cancer (unknown origin) in  mother.  Hypothyroidism and diabetes mellitus in one sister.  Two sisters  and brother in good health.   SOCIAL HISTORY:  The patient lives with her husband.  No alcohol, tobacco,  or drug use.  Is ambulatory, gets around the house with a walker, sometimes  uses a wheelchair.   REVIEW OF SYSTEMS:  More than ten systems reviewed, other than HPI  essentially negative.  The patient denies any dizziness, shortness of  breath, chest pain, abdominal pain, or weakness.   PHYSICAL EXAMINATION:  VITAL SIGNS:  On admission, blood pressure 138/56,  pulse 76, respirations 20, temperature 98.3, sat is 92% on room air.  GENERAL:  Very pleasant elderly Caucasian woman lying in bed in no acute  distress.  Alert and oriented x 3.  Answers questions appropriately.  The  patient's husband at bedside also providing information about the patient's  medications.  HEENT:  Normocephalic, atraumatic.  PERRL.  Sclerae anicteric.  Mucous  membranes moist.  NECK:  Supple.  No LAD.  No JVD.   Extraocular muscles intact.  LUNGS:  Good air entry bilaterally.  Kyphoscoliosis noted.  No wheezing.  CARDIOVASCULAR:  S1 plus S2.  Regular rate rhythm.  No murmurs, rubs, or  gallops.  BREASTS:  Right mastectomy noted.  ABDOMEN:  Soft.  Positive bowel sounds.  No tenderness.  No masses.  EXTREMITIES:  No cyanosis, clubbing, or edema.  Strength 5/5 upper  extremities, 4/5 lower extremities.  NEUROLOGIC:  Cranial nerves II-XII grossly intact.  Sensory motor within  normal limits.  DTRs 1+ bilateral lower extremities and upper extremities.   LABORATORY DATA:  WBC 9.8, hemoglobin 13, hematocrit 40.5, platelets 246,  MCV 91.0.  PT 12.1, INR 0.9, PTT 28.  Sodium 139, potassium 4.4, chloride  103, BUN 18, glucose 139.  Hemoccult positive.   ASSESSMENT:  A 77 year old Caucasian woman presenting with several episodes  of bright red blood per rectum, starting today, with no precipitating cause.   PLAN:  1.  Bright red blood per rectum.  We will check serial H&H's q.6h.  The      patient is not orthostatic.  Blood pressure is 138/56.  GI will be      consulted in the a.m. for a colonoscopy since the patient has not had a      colonoscopy for a number of years, this is most likely a lower GI bleed.      We will also start Protonix.  The patient has been typed and screened.      If hemoglobin drops to less than 9, we will transfuse two units packed      red blood cells each over three hours with 20 of Lasix after each unit.      The patient will also be given 1 liter of IV fluids at 75 ml/hr in light      of the patient's history of cor pulmonale and coronary artery disease,      we will be hydrating her very gently.  She will also be given oxygen by      nasal cannula at a rate to keep saturation greater than 93%.  2.  Diabetes mellitus.  For now we will keep her on a sliding scale since it     is unclear what dose she is on off  her Amaryl.  Also the patient will be      NPO, except meds,  until seen by GI in the a.m.  3.  Hyperlipidemia.  We will resume her Lipitor at home dose.  4.  Hypertension.  Imdur, Diovan, and metoprolol have been resumed with      instructions to hold for systolic pressure less than 100 or heart rate      less than 60.  5.  Coronary artery disease.  We have stopped her aspirin and Plavix for the      present time.  We will resume these after GI evaluation complete.  6.  Respiratory.  We have resumed her Advair and Spiriva at home doses.  She      is also on albuterol nebs p.r.n.  Her respiratory difficulties are      secondary to her severe scoliosis secondary to polio, however, at the      present time the patient is in no distress.  7.  Prophylaxis.  The patient will be given SCDs for DVT prophylaxis,      Protonix for GI prophylaxis.   DISPOSITION:  After resolution of acute symptoms and GI workup, the patient  will be discharged home.      GDK/MEDQ  D:  03/14/2005  T:  03/14/2005  Job:  [id number]   cc:   Janae Bridgeman. Eloise Harman., M.D.  [address]  [place]  [place] [address]  Fax: [phone]   Antionette Char, MD  [address]  [place]  [place] [address]  Fax: [phone]   Georgiana Spinner, M.D.  [address]  [place]  [place] [address]  Fax: [phone]

## 2011-03-29 IMAGING — CR DG CHEST 2V
2 series · 2 of 2 positions shown · non-contrast
Comparison: 02/13/2009.

CLINICAL DATA: Chest pain and short of breath.

CHEST - 2 VIEW

[w chest pa]
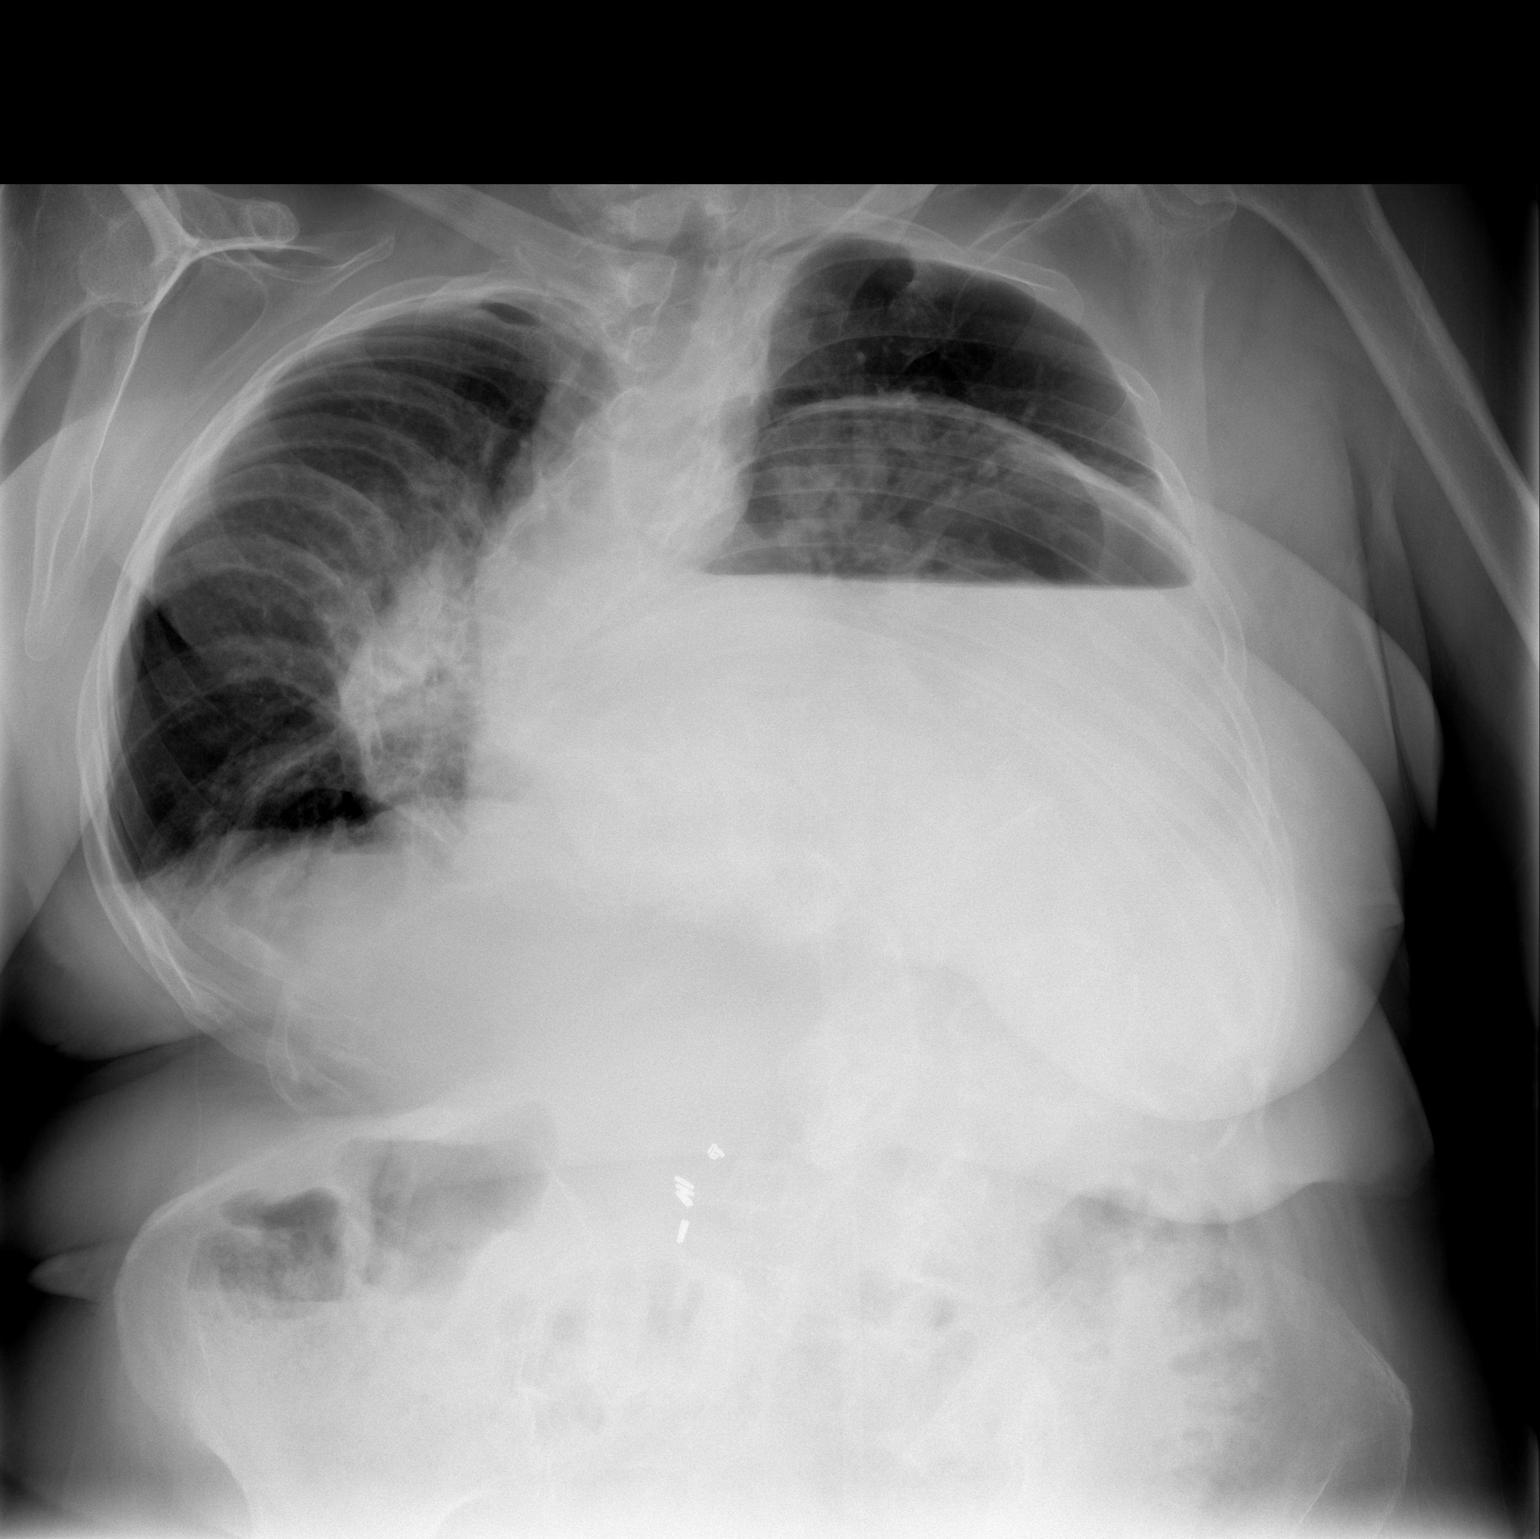

[w chest lat]
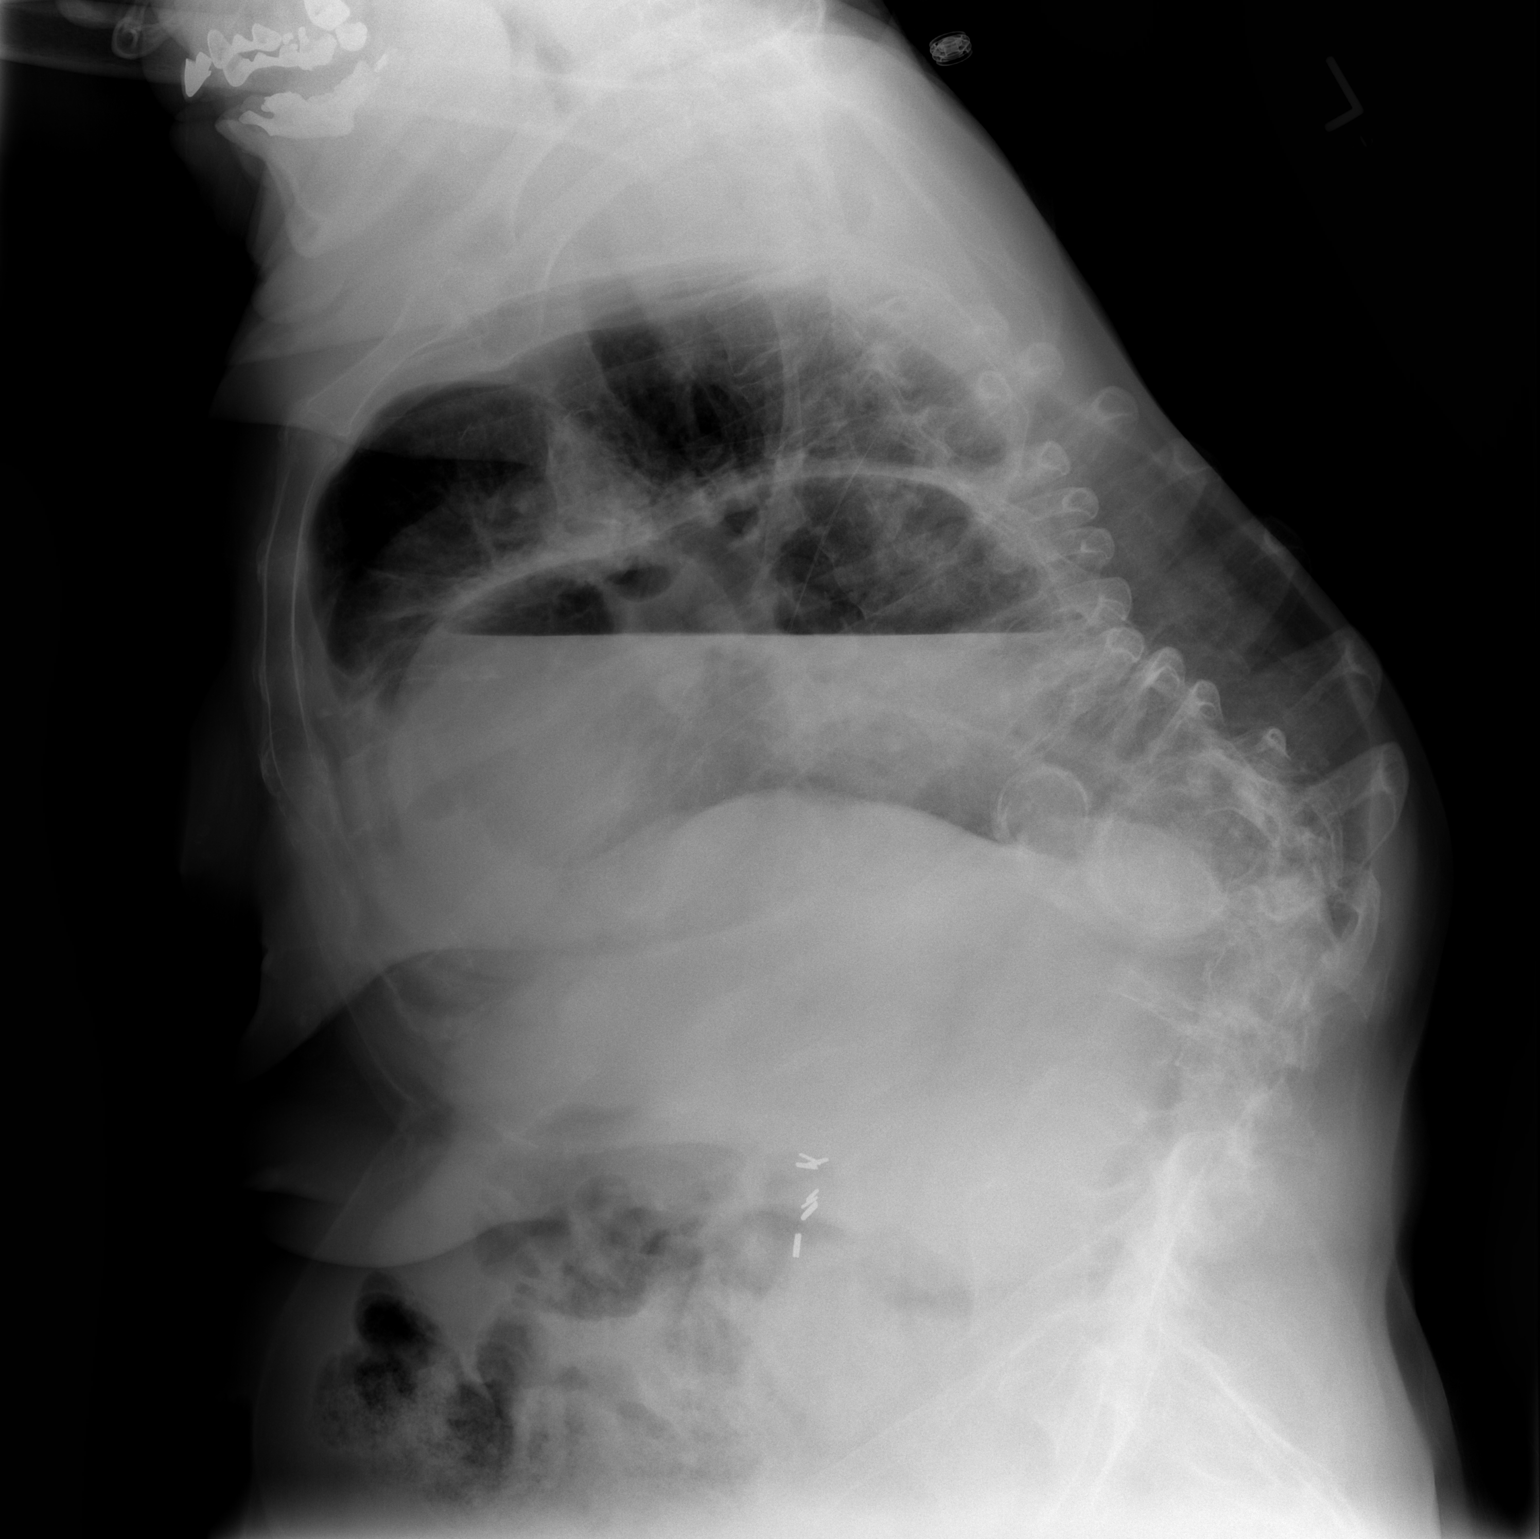

[2 of 2 positions shown; findings below may reference images not displayed]

FINDINGS: Marked chest deformity due to kyphoscoliosis deformity in
the lower thoracic spine.  There is marked elevation of left
hemidiaphragm with an air-fluid level the stomach.  There is left
lower lobe atelectasis which is unchanged.  There is no acute
infiltrate on the right.  Negative for edema.
IMPRESSION: Marked hypoventilation and chest wall deformity.  No acute
abnormality and no interval change.

## 2011-06-25 ENCOUNTER — Encounter: Payer: Self-pay | Admitting: Internal Medicine

## 2011-06-28 ENCOUNTER — Encounter: Payer: Self-pay | Admitting: Internal Medicine

## 2011-06-28 ENCOUNTER — Ambulatory Visit (INDEPENDENT_AMBULATORY_CARE_PROVIDER_SITE_OTHER): Payer: Medicare Other | Admitting: Internal Medicine

## 2011-06-28 VITALS — BP 118/78 | HR 69 | Temp 97.6°F | Ht <= 58 in | Wt 146.2 lb

## 2011-06-28 DIAGNOSIS — I2781 Cor pulmonale (chronic): Secondary | ICD-10-CM

## 2011-06-28 DIAGNOSIS — I279 Pulmonary heart disease, unspecified: Secondary | ICD-10-CM

## 2011-06-28 DIAGNOSIS — Z23 Encounter for immunization: Secondary | ICD-10-CM

## 2011-06-28 NOTE — Patient Instructions (Signed)
Glad your breathing is atleast stable Please continue ymca exercises Please have flu shot today Please discuss with Dr. Selena Batten about memory issues Followup in 9 months or sooner if there are problems/concerns

## 2011-06-28 NOTE — Progress Notes (Signed)
  Subjective:    Patient ID: Meghan Casey, female    DOB: [birth year], 77 y.o.   MRN: [id number]  HPI      OV 06/28/11: Last seen March 8. 2012. Followup for Dypnea in seting of scoliosis, cor pulmonale (sever pulm htn on echo feb 2012, neg cardiac stress test) due to polio. Class 3 with oxygen and stable. Doing exercises at [facility]. No new resp issues. No admissions. Husband notices and patient too that cognitiion and memory decreased; they both are sure it is alzheimers's dementia. Not able to do 700 puzzles (used to ace 2000 puzzles a few years ago). Forgets meals prepared. Long term memory ok. Able to self care within limits of scoliosis. No incontinence. No dysphagia  PPMHX: husband and patient suspecting alzheimer dementia  Social, Fam hx: reviewd no change  Review of Systems  Constitutional: Negative for fever and unexpected weight change.  HENT: Negative for ear pain, nosebleeds, congestion, sore throat, rhinorrhea, sneezing, trouble swallowing, dental problem, postnasal drip and sinus pressure.   Eyes: Negative for redness and itching.  Respiratory: Negative for cough, chest tightness, shortness of breath and wheezing.   Cardiovascular: Negative for palpitations and leg swelling.  Gastrointestinal: Negative for nausea and vomiting.  Genitourinary: Negative for dysuria.  Musculoskeletal: Negative for joint swelling.  Skin: Negative for rash.  Neurological: Negative for headaches.  Hematological: Does not bruise/bleed easily.  Psychiatric/Behavioral: Negative for dysphoric mood. The patient is not nervous/anxious.        Objective:   Physical Exam  General: short woman sitting in wheel chair with o2  Head: normocephalic and atraumatic  Eyes: PERRLA/EOM intact; conjunctiva and sclera clear  Ears: TMs intact and clear with normal canals  Nose: no deformity, discharge, inflammation, or lesions  Mouth: no deformity or lesions  Neck: no masses, thyromegaly, or abnormal cervical  nodes  Chest Wall: severe S shapped scoliosis  small chest  barrell chest  Lungs: coarse BS throughout and prolonged exhilation.  Heart: regular rate and rhythm, S1, S2 without rubs, gallops, or clicks. Murmur NOS present.  Abdomen: bowel sounds positive; abdomen soft and non-tender without masses, or organomegaly  Msk: no deformity or scoliosis noted with normal posture  Pulses: pulses normal  Extremities: minimal ankle edema  Neurologic: CN II-XII grossly intact with normal reflexes, coordination, muscle strength and tone  Skin: intact without lesions or rashes  Cervical Nodes: no significant adenopathy  Axillary Nodes: no significant adenopathy  Psych: alert and cooperative; normal mood and affect; normal attention span and concentration       Assessment & Plan:

## 2011-06-29 DIAGNOSIS — Z23 Encounter for immunization: Secondary | ICD-10-CM

## 2011-06-30 DIAGNOSIS — I2781 Cor pulmonale (chronic): Secondary | ICD-10-CM | POA: Insufficient documentation

## 2011-06-30 NOTE — Assessment & Plan Note (Signed)
Glad your breathing is atleast stable Please continue [facility] exercises Please have flu shot today Please discuss with Dr. Selena Batten about memory issues Followup in 9 months or sooner if there are problems/concerns Will continue to monitor, if deteriorates then we can move towards pallation

## 2011-07-20 ENCOUNTER — Encounter: Payer: Self-pay | Admitting: *Deleted

## 2011-07-22 ENCOUNTER — Other Ambulatory Visit: Payer: Self-pay | Admitting: Internal Medicine

## 2011-07-22 DIAGNOSIS — Z1231 Encounter for screening mammogram for malignant neoplasm of breast: Secondary | ICD-10-CM

## 2011-07-22 DIAGNOSIS — Z9011 Acquired absence of right breast and nipple: Secondary | ICD-10-CM

## 2011-07-27 ENCOUNTER — Other Ambulatory Visit: Payer: Self-pay | Admitting: Oncology

## 2011-07-27 DIAGNOSIS — C50919 Malignant neoplasm of unspecified site of unspecified female breast: Secondary | ICD-10-CM

## 2011-08-16 ENCOUNTER — Other Ambulatory Visit (HOSPITAL_BASED_OUTPATIENT_CLINIC_OR_DEPARTMENT_OTHER): Payer: Medicare Other

## 2011-08-16 ENCOUNTER — Other Ambulatory Visit: Payer: Self-pay | Admitting: Oncology

## 2011-08-16 ENCOUNTER — Encounter: Payer: Self-pay | Admitting: Oncology

## 2011-08-16 ENCOUNTER — Ambulatory Visit: Payer: Medicare Other | Admitting: Oncology

## 2011-08-16 ENCOUNTER — Telehealth: Payer: Self-pay | Admitting: *Deleted

## 2011-08-16 VITALS — BP 133/68 | HR 69 | Temp 97.6°F | Ht <= 58 in | Wt 146.1 lb

## 2011-08-16 DIAGNOSIS — C50919 Malignant neoplasm of unspecified site of unspecified female breast: Secondary | ICD-10-CM | POA: Insufficient documentation

## 2011-08-16 DIAGNOSIS — C7952 Secondary malignant neoplasm of bone marrow: Secondary | ICD-10-CM

## 2011-08-16 HISTORY — DX: Malignant neoplasm of unspecified site of unspecified female breast: C50.919

## 2011-08-16 LAB — COMPREHENSIVE METABOLIC PANEL
ALT: 11 U/L (ref 0–35)
Albumin: 3.9 g/dL (ref 3.5–5.2)
CO2: 37 mEq/L — ABNORMAL HIGH (ref 19–32)
Chloride: 100 mEq/L (ref 96–112)
Glucose, Bld: 176 mg/dL — ABNORMAL HIGH (ref 70–99)
Potassium: 4 mEq/L (ref 3.5–5.3)
Sodium: 142 mEq/L (ref 135–145)
Total Protein: 6 g/dL (ref 6.0–8.3)

## 2011-08-16 LAB — CBC WITH DIFFERENTIAL/PLATELET
BASO%: 0.6 % (ref 0.0–2.0)
Eosinophils Absolute: 0 10*3/uL (ref 0.0–0.5)
MCHC: 32.6 g/dL (ref 31.5–36.0)
MONO#: 0.5 10*3/uL (ref 0.1–0.9)
NEUT#: 5.7 10*3/uL (ref 1.5–6.5)
Platelets: 200 10*3/uL (ref 145–400)
RBC: 4.22 10*6/uL (ref 3.70–5.45)
RDW: 13.6 % (ref 11.2–14.5)
WBC: 7.3 10*3/uL (ref 3.9–10.3)
lymph#: 1.1 10*3/uL (ref 0.9–3.3)

## 2011-08-16 NOTE — Telephone Encounter (Signed)
Gave patient appointment for 08-2012 

## 2011-08-16 NOTE — Progress Notes (Signed)
Note dictated

## 2011-08-17 NOTE — Progress Notes (Signed)
CC:   Massie Maroon, MD  DIAGNOSIS:  This is a 77 year old female with metastatic breast cancer with bone metastasis.  PAST THERAPY:  Status post aromatase inhibitor consisting of Femara starting in April 2001 and completed in June 2006.  She then received tamoxifen 20 mg from October 2000 through October 2001.  CURRENT THERAPY:  Observation.  INTERVAL HISTORY:  The patient is seen in followup.  Overall, she is doing well.  She continues to be O2 dependent due to her underlying COPD.  She is currently on 3 L on a daily basis.  She does have some insomnia, which is ongoing, and occasionally get headaches.  She is tired and fatigued.  She has arthritic-type achiness.  REVIEW OF SYSTEMS:  The remainder of the 10-point review of systems is unremarkable.  PHYSICAL EXAMINATION:  General Appearance:  The patient is awake and alert in no acute distress.  Vital Signs:  Recorded separately in the EMR.  HEENT Exam:  EOMI.  PERRLA.  Sclerae are anicteric.  No conjunctival pallor.  Oral mucosa is moist.  Lungs:  Clear, but distant breath sounds.  Cardiovascular:  Regular rate and rhythm.  No murmurs. Abdomen:  Soft, nontender, nondistended.  Bowel sounds are present.  No HSM.  Extremities:  No edema.  Breasts:  The left breast has no masses or nipple discharge.  Right mastectomy scar is well-healed.  No nodularity.  No evidence of local recurrence.  LABORATORY DATA:  WBC 7.3, hemoglobin 12.6, hematocrit 38.7, and platelets are 200,000.  IMPRESSION AND PLAN:  This is a 77 year old female with metastatic breast cancer with bone metastasis off therapy without clinical evidence of progression of disease.  Overall, she is doing well.  We will continue to follow her on a yearly basis.  The next time she will be seen in the survivorship clinic.    ______________________________ Drue Second, M.D. KK/MEDQ  D:  08/16/2011  T:  08/17/2011  Job:  [id number]

## 2011-08-30 ENCOUNTER — Ambulatory Visit
Admission: RE | Admit: 2011-08-30 | Discharge: 2011-08-30 | Disposition: A | Discharge status: Home / Self Care | Payer: Medicare Other | Source: Ambulatory Visit | Attending: Internal Medicine | Admitting: Internal Medicine

## 2011-08-30 DIAGNOSIS — Z1231 Encounter for screening mammogram for malignant neoplasm of breast: Secondary | ICD-10-CM

## 2011-08-30 DIAGNOSIS — Z9011 Acquired absence of right breast and nipple: Secondary | ICD-10-CM

## 2012-02-28 ENCOUNTER — Encounter: Payer: Self-pay | Admitting: Gastroenterology

## 2012-03-27 ENCOUNTER — Ambulatory Visit (INDEPENDENT_AMBULATORY_CARE_PROVIDER_SITE_OTHER): Payer: Medicare Other | Admitting: Gastroenterology

## 2012-03-27 ENCOUNTER — Encounter: Payer: Self-pay | Admitting: Gastroenterology

## 2012-03-27 VITALS — BP 128/60 | HR 68 | Ht <= 58 in | Wt 146.0 lb

## 2012-03-27 DIAGNOSIS — K219 Gastro-esophageal reflux disease without esophagitis: Secondary | ICD-10-CM

## 2012-03-27 DIAGNOSIS — R1013 Epigastric pain: Secondary | ICD-10-CM

## 2012-03-27 MED ORDER — DEXLANSOPRAZOLE 60 MG PO CPDR: 60.0000 mg | DELAYED_RELEASE_CAPSULE | Freq: Every day | ORAL | Status: DC

## 2012-03-27 NOTE — Progress Notes (Signed)
History of Present Illness: This is a 78 year old female with multiple comorbidities as outlined below. She's previously been evaluated by Dr. Virginia Rochester and underwent colonoscopy and upper endoscopy in 2006 showing diverticulosis and a hiatal hernia. She has severe COPD and cor pulmonale. She is maintained on 3 L of oxygen per nasal cannula continuously. She is not felt to be a candidate for elective surgery based on a significantly elevated cardiopulmonary risk. She relates epigastric discomfort and reflux symptoms following meals. She notes early satiety. Her history is somewhat limited due to memory difficulties. Her husband provides some of the history. Protonix was recently increased from daily to twice daily and she did have some symptomatic improvement. Recent blood work reviewed from Dr. Elmyra Ricks office was unremarkable. Denies weight loss, constipation, diarrhea, change in stool caliber, melena, hematochezia, nausea, vomiting, dysphagia, chest pain.  Allergies  Allergen Reactions  . Dipyridamole     REACTION: shakes   Outpatient Prescriptions Prior to Visit  Medication Sig Dispense Refill  . aspirin 81 MG tablet 2 tablets daily       . clopidogrel (PLAVIX) 75 MG tablet Take 75 mg by mouth daily.        . digoxin (LANOXIN) 0.25 MG tablet 1/2 tablet every am and pm       . diphenhydramine-acetaminophen (TYLENOL PM) 25-500 MG TABS Take 1 tablet by mouth at bedtime as needed.        . Fluticasone-Salmeterol (ADVAIR) 250-50 MCG/DOSE AEPB Inhale 1 puff into the lungs every 12 (twelve) hours.        . folic acid (FOLVITE) 800 MCG tablet Take 400 mcg by mouth daily.        Marland Kitchen glimepiride (AMARYL) 2 MG tablet Take 1.5 tablets daily      . Glucosamine Sulfate 750 MG TABS 750 mg 2 (two) times daily. Tablet once a day      . isosorbide mononitrate (ISMO,MONOKET) 20 MG tablet Take 30 mg by mouth daily.        . metoprolol (LOPRESSOR) 50 MG tablet Take 50 mg by mouth daily.        . montelukast (SINGULAIR) 10 MG  tablet Take 10 mg by mouth as needed.       . Multiple Vitamin (MULTIVITAMIN) capsule Take 1 capsule by mouth daily.        . nitroGLYCERIN (NITROSTAT) 0.4 MG SL tablet Place 0.4 mg under the tongue every 5 (five) minutes as needed.        Marland Kitchen oxyCODONE-acetaminophen (PERCOCET) 7.5-325 MG per tablet Take 1 tablet by mouth every 4 (four) hours as needed. Pt takes1 a day      . pantoprazole (PROTONIX) 40 MG tablet Take 40 mg by mouth 2 (two) times daily.       . pravastatin (PRAVACHOL) 80 MG tablet Take 80 mg by mouth daily.        . sitaGLIPtin (JANUVIA) 100 MG tablet Take 100 mg by mouth daily.        Marland Kitchen tiotropium (SPIRIVA) 18 MCG inhalation capsule Place 18 mcg into inhaler and inhale daily.        Marland Kitchen torsemide (DEMADEX) 20 MG tablet Take 20 mg by mouth daily as needed.        . valsartan (DIOVAN) 160 MG tablet Take 160 mg by mouth daily.        . propoxyphene-acetaminophen (PROPOXACET-N) 100-650 MG per tablet Take 1 tablet by mouth every 6 (six) hours as needed.  Past Medical History  Diagnosis Date  . Diabetes mellitus, type 2   . Hyperlipidemia   . Hypertension   . Atrial fibrillation   . CAD (coronary artery disease)   . CHF (congestive heart failure)   . Sleep apnea   . Post-polio syndrome   . Scoliosis   . GI bleed   . COPD (chronic obstructive pulmonary disease)   . CHF (congestive heart failure)   . Bone metastases   . Breast cancer metastasized to bone 08/16/2011  . Diverticulosis   . GERD (gastroesophageal reflux disease)   . Asthma   . History of colonic polyps   . Depression   . History of jaundice    Past Surgical History  Procedure Date  . Total abdominal hysterectomy 1985  . Bunionectomy bilateral  . Left elbow surgery   . Mastectomy 2000    right  . Appendectomy 1985  . Cholecystectomy 1985  . Ptca 07/20/2000, 11/13/2003    stents x 2   History   Social History  . Marital Status: Married    Spouse Name: N/A    Number of Children: 4  . Years of  Education: N/A   Occupational History  . housewife    Social History Main Topics  . Smoking status: Never Smoker   . Smokeless tobacco: Never Used  . Alcohol Use: No  . Drug Use: No  . Sexually Active: None   Other Topics Concern  . None   Social History Narrative  . None   Family History  Problem Relation Age of Onset  . Hypertension Mother   . Cancer Mother     bladder  . Hypercalcemia Mother   . Heart attack Father   . Diabetes Father   . Coronary artery disease Father   . Stroke Maternal Grandfather   . Stroke Paternal Grandmother   . Stroke Paternal Grandfather     Review of Systems: Pertinent positive and negative review of systems were noted in the above HPI section. All other review of systems were otherwise negative.  Physical Exam: General: Well developed , well nourished, O2 by [place], in a wheel chair Head: Normocephalic and atraumatic Eyes:  sclerae anicteric, EOMI Ears: Normal auditory acuity Mouth: No deformity or lesions Neck: Supple, no masses or thyromegaly Lungs: decreased BS throughout Heart: Regular rate and rhythm; no murmurs, rubs or bruits Abdomen: Soft, non tender and non distended. No masses, hepatosplenomegaly or hernias noted. Normal Bowel sounds Musculoskeletal: Symmetrical with no gross deformities  Skin: No lesions on visible extremities Pulses:  Normal pulses noted Extremities: No clubbing, cyanosis, edema or deformities noted Neurological: Alert oriented x 4, decreased memory Cervical Nodes:  No significant cervical adenopathy Inguinal Nodes: No significant inguinal adenopathy Psychological:  Alert and cooperative. Normal mood and affect  Assessment and Recommendations:  1. Postprandial epigastric pain, early satiety and reflux symptoms. Possible exacerbation of GERD, gastroparesis, mesenteric insufficiency or other disorder. She is at a high risk for sedation and invasive procedures given her severe oxygen-dependent COPD and cor  pulmonale. Therefore we will not plan to proceed with upper endoscopy. She is advised to have multiple small meals a day which will help with gastroparesis, mesenteric insufficiency or GERD. Change to Dexilant 60 mg po daily. Consider an upper GI series, gastric emptying scan or CTA scan of the abdomen if her symptoms do not improve.

## 2012-03-27 NOTE — Patient Instructions (Addendum)
Hold Protonix and start Dexilant samples one tablet by mouth once daily x 2 weeks.  Patient advised to avoid spicy, acidic, citrus, chocolate, mints, fruit and fruit juices.  Limit the intake of caffeine, alcohol and Soda.  Don't exercise too soon after eating.  Don't lie down within 3-4 hours of eating.  Elevate the head of your bed.  cc: Pearson Grippe, MD

## 2012-06-03 ENCOUNTER — Encounter (HOSPITAL_COMMUNITY): Payer: Self-pay | Admitting: Internal Medicine

## 2012-06-03 ENCOUNTER — Emergency Department (HOSPITAL_COMMUNITY): Payer: Medicare Other

## 2012-06-03 ENCOUNTER — Ambulatory Visit (INDEPENDENT_AMBULATORY_CARE_PROVIDER_SITE_OTHER): Payer: Medicare Other | Admitting: Family Medicine

## 2012-06-03 ENCOUNTER — Observation Stay (HOSPITAL_COMMUNITY)
Admission: EM | Admit: 2012-06-03 | Discharge: 2012-06-04 | Disposition: A | Discharge status: Home / Self Care | Payer: Medicare Other | Attending: Internal Medicine | Admitting: Internal Medicine

## 2012-06-03 VITALS — BP 151/81 | HR 110 | Resp 36

## 2012-06-03 DIAGNOSIS — Z66 Do not resuscitate: Secondary | ICD-10-CM | POA: Insufficient documentation

## 2012-06-03 DIAGNOSIS — I10 Essential (primary) hypertension: Secondary | ICD-10-CM | POA: Diagnosis present

## 2012-06-03 DIAGNOSIS — J4489 Other specified chronic obstructive pulmonary disease: Secondary | ICD-10-CM | POA: Insufficient documentation

## 2012-06-03 DIAGNOSIS — C7951 Secondary malignant neoplasm of bone: Secondary | ICD-10-CM | POA: Diagnosis present

## 2012-06-03 DIAGNOSIS — E785 Hyperlipidemia, unspecified: Secondary | ICD-10-CM | POA: Insufficient documentation

## 2012-06-03 DIAGNOSIS — G4730 Sleep apnea, unspecified: Secondary | ICD-10-CM | POA: Insufficient documentation

## 2012-06-03 DIAGNOSIS — I4891 Unspecified atrial fibrillation: Secondary | ICD-10-CM | POA: Insufficient documentation

## 2012-06-03 DIAGNOSIS — M4120 Other idiopathic scoliosis, site unspecified: Secondary | ICD-10-CM

## 2012-06-03 DIAGNOSIS — E119 Type 2 diabetes mellitus without complications: Secondary | ICD-10-CM | POA: Diagnosis present

## 2012-06-03 DIAGNOSIS — I2510 Atherosclerotic heart disease of native coronary artery without angina pectoris: Secondary | ICD-10-CM | POA: Insufficient documentation

## 2012-06-03 DIAGNOSIS — R0602 Shortness of breath: Secondary | ICD-10-CM

## 2012-06-03 DIAGNOSIS — R040 Epistaxis: Principal | ICD-10-CM | POA: Diagnosis present

## 2012-06-03 DIAGNOSIS — R0902 Hypoxemia: Secondary | ICD-10-CM

## 2012-06-03 DIAGNOSIS — J449 Chronic obstructive pulmonary disease, unspecified: Secondary | ICD-10-CM | POA: Insufficient documentation

## 2012-06-03 DIAGNOSIS — I2781 Cor pulmonale (chronic): Secondary | ICD-10-CM

## 2012-06-03 DIAGNOSIS — B91 Sequelae of poliomyelitis: Secondary | ICD-10-CM | POA: Insufficient documentation

## 2012-06-03 DIAGNOSIS — I509 Heart failure, unspecified: Secondary | ICD-10-CM | POA: Diagnosis present

## 2012-06-03 DIAGNOSIS — C50919 Malignant neoplasm of unspecified site of unspecified female breast: Secondary | ICD-10-CM | POA: Diagnosis present

## 2012-06-03 DIAGNOSIS — K219 Gastro-esophageal reflux disease without esophagitis: Secondary | ICD-10-CM | POA: Insufficient documentation

## 2012-06-03 HISTORY — DX: Unspecified dementia, unspecified severity, without behavioral disturbance, psychotic disturbance, mood disturbance, and anxiety: F03.90

## 2012-06-03 LAB — CBC WITH DIFFERENTIAL/PLATELET
Basophils Relative: 0 % (ref 0–1)
Eosinophils Absolute: 0.1 10*3/uL (ref 0.0–0.7)
Eosinophils Relative: 1 % (ref 0–5)
Hemoglobin: 12.8 g/dL (ref 12.0–15.0)
Lymphs Abs: 0.7 10*3/uL (ref 0.7–4.0)
MCH: 29 pg (ref 26.0–34.0)
MCHC: 30.2 g/dL (ref 30.0–36.0)
MCV: 95.9 fL (ref 78.0–100.0)
Monocytes Absolute: 0.8 10*3/uL (ref 0.1–1.0)
Monocytes Relative: 5 % (ref 3–12)
Neutrophils Relative %: 90 % — ABNORMAL HIGH (ref 43–77)

## 2012-06-03 LAB — POCT I-STAT 3, VENOUS BLOOD GAS (G3P V)
Bicarbonate: 41.3 mEq/L — ABNORMAL HIGH (ref 20.0–24.0)
TCO2: 44 mmol/L (ref 0–100)
pCO2, Ven: 88.4 mmHg (ref 45.0–50.0)
pH, Ven: 7.278 (ref 7.250–7.300)

## 2012-06-03 LAB — BASIC METABOLIC PANEL
BUN: 18 mg/dL (ref 6–23)
Chloride: 100 mEq/L (ref 96–112)
GFR calc non Af Amer: 80 mL/min — ABNORMAL LOW (ref >90)
Glucose, Bld: 290 mg/dL — ABNORMAL HIGH (ref 70–99)
Potassium: 4.2 mEq/L (ref 3.5–5.1)
Sodium: 142 mEq/L (ref 135–145)

## 2012-06-03 LAB — GLUCOSE, CAPILLARY: Glucose-Capillary: 154 mg/dL — ABNORMAL HIGH (ref 70–99)

## 2012-06-03 LAB — URINALYSIS, MICROSCOPIC ONLY
Glucose, UA: NEGATIVE mg/dL
Nitrite: NEGATIVE
Specific Gravity, Urine: 1.024 (ref 1.005–1.030)
pH: 5.5 (ref 5.0–8.0)

## 2012-06-03 LAB — POCT I-STAT TROPONIN I

## 2012-06-03 MED ORDER — PANTOPRAZOLE SODIUM 40 MG PO TBEC
40.0000 mg | DELAYED_RELEASE_TABLET | Freq: Two times a day (BID) | ORAL | Status: DC
  Administered 2012-06-03 – 2012-06-04 (×2): 40 mg via ORAL
  Filled 2012-06-03 (×2): qty 1

## 2012-06-03 MED ORDER — ONDANSETRON HCL 4 MG/2ML IJ SOLN: 4.0000 mg | Freq: Four times a day (QID) | INTRAMUSCULAR | Status: DC | PRN

## 2012-06-03 MED ORDER — FOLIC ACID 1 MG PO TABS
1.0000 mg | ORAL_TABLET | Freq: Every day | ORAL | Status: DC
  Administered 2012-06-04: 1 mg via ORAL
  Filled 2012-06-03: qty 1

## 2012-06-03 MED ORDER — TORSEMIDE 20 MG PO TABS
20.0000 mg | ORAL_TABLET | Freq: Once | ORAL | Status: AC
  Administered 2012-06-03: 20 mg via ORAL
  Filled 2012-06-03: qty 1

## 2012-06-03 MED ORDER — INSULIN ASPART 100 UNIT/ML ~~LOC~~ SOLN
0.0000 [IU] | Freq: Three times a day (TID) | SUBCUTANEOUS | Status: DC
  Administered 2012-06-04: 1 [IU] via SUBCUTANEOUS

## 2012-06-03 MED ORDER — MULTIVITAMINS PO CAPS: 1.0000 | ORAL_CAPSULE | Freq: Every day | ORAL | Status: DC

## 2012-06-03 MED ORDER — FLUTICASONE-SALMETEROL 250-50 MCG/DOSE IN AEPB
1.0000 | INHALATION_SPRAY | Freq: Two times a day (BID) | RESPIRATORY_TRACT | Status: DC
  Administered 2012-06-04: 1 via RESPIRATORY_TRACT
  Filled 2012-06-03: qty 14

## 2012-06-03 MED ORDER — FUROSEMIDE 10 MG/ML IJ SOLN
40.0000 mg | Freq: Once | INTRAMUSCULAR | Status: AC
  Administered 2012-06-03: 40 mg via INTRAVENOUS
  Filled 2012-06-03: qty 4

## 2012-06-03 MED ORDER — FOLIC ACID 800 MCG PO TABS: 400.0000 ug | ORAL_TABLET | Freq: Every day | ORAL | Status: DC

## 2012-06-03 MED ORDER — SODIUM CHLORIDE 0.9 % IV SOLN: INTRAVENOUS | Status: DC

## 2012-06-03 MED ORDER — CLOPIDOGREL BISULFATE 75 MG PO TABS
75.0000 mg | ORAL_TABLET | Freq: Every day | ORAL | Status: DC
  Administered 2012-06-04: 75 mg via ORAL
  Filled 2012-06-03: qty 1

## 2012-06-03 MED ORDER — ASPIRIN 81 MG PO CHEW
81.0000 mg | CHEWABLE_TABLET | Freq: Every day | ORAL | Status: DC
  Administered 2012-06-04: 81 mg via ORAL
  Filled 2012-06-03: qty 1

## 2012-06-03 MED ORDER — LINAGLIPTIN 5 MG PO TABS
5.0000 mg | ORAL_TABLET | Freq: Every day | ORAL | Status: DC
  Administered 2012-06-04: 5 mg via ORAL
  Filled 2012-06-03: qty 1

## 2012-06-03 MED ORDER — SIMVASTATIN 40 MG PO TABS
40.0000 mg | ORAL_TABLET | Freq: Every day | ORAL | Status: DC
  Filled 2012-06-03: qty 1

## 2012-06-03 MED ORDER — MONTELUKAST SODIUM 10 MG PO TABS
10.0000 mg | ORAL_TABLET | Freq: Every day | ORAL | Status: DC
Start: 2012-06-03 — End: 2012-06-04
  Administered 2012-06-03: 10 mg via ORAL
  Filled 2012-06-03 (×2): qty 1

## 2012-06-03 MED ORDER — ADULT MULTIVITAMIN W/MINERALS CH
1.0000 | ORAL_TABLET | Freq: Every day | ORAL | Status: DC
  Administered 2012-06-04: 1 via ORAL
  Filled 2012-06-03: qty 1

## 2012-06-03 MED ORDER — IRBESARTAN 150 MG PO TABS
150.0000 mg | ORAL_TABLET | Freq: Every day | ORAL | Status: DC
  Administered 2012-06-04: 150 mg via ORAL
  Filled 2012-06-03: qty 1

## 2012-06-03 MED ORDER — ISOSORBIDE MONONITRATE 20 MG PO TABS
30.0000 mg | ORAL_TABLET | Freq: Every day | ORAL | Status: DC
  Administered 2012-06-04: 30 mg via ORAL
  Filled 2012-06-03: qty 1

## 2012-06-03 MED ORDER — GLIMEPIRIDE 2 MG PO TABS
2.0000 mg | ORAL_TABLET | Freq: Every day | ORAL | Status: DC
  Administered 2012-06-04: 2 mg via ORAL
  Filled 2012-06-03 (×2): qty 1

## 2012-06-03 MED ORDER — TIOTROPIUM BROMIDE MONOHYDRATE 18 MCG IN CAPS
18.0000 ug | ORAL_CAPSULE | Freq: Every day | RESPIRATORY_TRACT | Status: DC
  Administered 2012-06-04: 18 ug via RESPIRATORY_TRACT
  Filled 2012-06-03: qty 5

## 2012-06-03 MED ORDER — ASPIRIN 81 MG PO TABS: 81.0000 mg | ORAL_TABLET | Freq: Every day | ORAL | Status: DC

## 2012-06-03 MED ORDER — DIGOXIN 125 MCG PO TABS
0.1250 mg | ORAL_TABLET | Freq: Every day | ORAL | Status: DC
  Administered 2012-06-04: 0.125 mg via ORAL
  Filled 2012-06-03: qty 1

## 2012-06-03 MED ORDER — GLUCOSAMINE SULFATE 750 MG PO TABS: 750.0000 mg | ORAL_TABLET | Freq: Two times a day (BID) | ORAL | Status: DC

## 2012-06-03 MED ORDER — ONDANSETRON HCL 4 MG PO TABS: 4.0000 mg | ORAL_TABLET | Freq: Four times a day (QID) | ORAL | Status: DC | PRN

## 2012-06-03 MED ORDER — METOPROLOL SUCCINATE ER 50 MG PO TB24
50.0000 mg | ORAL_TABLET | Freq: Every day | ORAL | Status: DC
  Administered 2012-06-04: 50 mg via ORAL
  Filled 2012-06-03: qty 1

## 2012-06-03 NOTE — ED Notes (Signed)
resp at bedside for eval

## 2012-06-03 NOTE — ED Provider Notes (Signed)
History     CSN: [id number]  Arrival date & time 06/03/12  1518   First MD Initiated Contact with Patient 06/03/12 1549      Chief Complaint  Patient presents with  . Epistaxis  . Respiratory Distress    (Consider location/radiation/quality/duration/timing/severity/associated sxs/prior treatment) HPI Meghan Casey is a 78 y.o. female presenting in respiratory distress also with left epistaxis, now resolved from Urgent care.  Pt has dementia, but history also given per husband.  SOB started after having nosebleed, pt is on Aggrenox, and nosebleed started after digital manipulation. No bleeding at this time, npt is not swallowing blood. Symptoms are severe, breathing has worsened and has been constant, has not responded to O2 at home using [place], not associated with chest pain or syncope.  No fever, chills, productive cough or hemoptysis. PT does not want to be intubated and is a DNR.  Past Medical History  Diagnosis Date  . Diabetes mellitus, type 2   . Hyperlipidemia   . Hypertension   . Atrial fibrillation   . CAD (coronary artery disease)   . CHF (congestive heart failure)   . Sleep apnea   . Post-polio syndrome   . Scoliosis   . GI bleed   . COPD (chronic obstructive pulmonary disease)   . CHF (congestive heart failure)   . Bone metastases   . Breast cancer metastasized to bone 08/16/2011  . Diverticulosis   . GERD (gastroesophageal reflux disease)   . Asthma   . History of colonic polyps   . Depression   . History of jaundice     Past Surgical History  Procedure Date  . Total abdominal hysterectomy 1985  . Bunionectomy bilateral  . Left elbow surgery   . Mastectomy 2000    right  . Appendectomy 1985  . Cholecystectomy 1985  . Ptca 07/20/2000, 11/13/2003    stents x 2    Family History  Problem Relation Age of Onset  . Hypertension Mother   . Cancer Mother     bladder  . Hypercalcemia Mother   . Heart attack Father   . Diabetes Father   . Coronary artery  disease Father   . Stroke Maternal Grandfather   . Stroke Paternal Grandmother   . Stroke Paternal Grandfather     History  Substance Use Topics  . Smoking status: Never Smoker   . Smokeless tobacco: Never Used  . Alcohol Use: No    OB History    Grav Para Term Preterm Abortions TAB SAB Ect Mult Living                  Review of Systems  Positive for SOB, difficulty breathing, epistaxsis; Patient denies any fevers or chills, changes in vision, earache, sore throat, neck pain or stiffness, chest pain or pressure, palpitations, syncope, abdominal pain, nausea, vomiting, diarrhea, melena, red bloody stools, frequency, dysuria, myalgias, arthralgias, back pain, recent trauma, rash, itching, skin lesions, easy bruising or bleeding, headache, seizures, numbness, tingling or weakness and denies depression, and anxiety.    Allergies  Dipyridamole  Home Medications   Current Outpatient Rx  Name Route Sig Dispense Refill  . ASPIRIN 81 MG PO TABS  2 tablets daily    . CLOPIDOGREL BISULFATE 75 MG PO TABS Oral Take 75 mg by mouth daily.      Marland Kitchen DIGOXIN 0.25 MG PO TABS  1/2 tablet every am and pm     . DIPHENHYDRAMINE-APAP (SLEEP) 25-500 MG PO TABS Oral Take  1 tablet by mouth at bedtime as needed.      Marland Kitchen FLUTICASONE-SALMETEROL 250-50 MCG/DOSE IN AEPB Inhalation Inhale 1 puff into the lungs every 12 (twelve) hours.      Marland Kitchen FOLIC ACID 800 MCG PO TABS Oral Take 400 mcg by mouth daily.      Marland Kitchen GLIMEPIRIDE 2 MG PO TABS  Take 1.5 tablets daily    . GLUCOSAMINE SULFATE 750 MG PO TABS  750 mg 2 (two) times daily. Tablet once a day    . ISOSORBIDE MONONITRATE 20 MG PO TABS Oral Take 30 mg by mouth daily.      Marland Kitchen MONTELUKAST SODIUM 10 MG PO TABS Oral Take 10 mg by mouth as needed.     . MULTIVITAMINS PO CAPS Oral Take 1 capsule by mouth daily.      Marland Kitchen NITROGLYCERIN 0.4 MG SL SUBL Sublingual Place 0.4 mg under the tongue every 5 (five) minutes as needed.      . OXYCODONE-ACETAMINOPHEN 7.5-325 MG PO TABS  Oral Take 1 tablet by mouth every 4 (four) hours as needed. Pt takes1 a day    . PANTOPRAZOLE SODIUM 40 MG PO TBEC Oral Take 40 mg by mouth 2 (two) times daily.     Marland Kitchen PRAVASTATIN SODIUM 80 MG PO TABS Oral Take 80 mg by mouth daily.      Marland Kitchen SITAGLIPTIN PHOSPHATE 100 MG PO TABS Oral Take 100 mg by mouth daily.      Marland Kitchen TIOTROPIUM BROMIDE MONOHYDRATE 18 MCG IN CAPS Inhalation Place 18 mcg into inhaler and inhale daily.      . TOPROL XL 50 MG PO TB24 Oral Take 1 tablet by mouth daily.    . TORSEMIDE 20 MG PO TABS Oral Take 20 mg by mouth daily as needed.      Marland Kitchen VALSARTAN 160 MG PO TABS Oral Take 160 mg by mouth daily.      . DEXLANSOPRAZOLE 60 MG PO CPDR Oral Take 1 capsule (60 mg total) by mouth daily. 20 capsule 0    BP 115/59  Pulse 80  Resp 33  SpO2 98%  Physical Exam VITAL SIGNS:   Filed Vitals:   06/04/12 0500  BP: 135/67  Pulse: 69  Temp: 97.9 F (36.6 C)  Resp: 28   CONSTITUTIONAL: Awake, somnolent, diaphoretic, respiratory distress HENT: Atraumatic, normocephalic, oral mucosa pink and moist, airway patent. Right nare patent without drainage. Left Nare has tissue stuck in it with dark red blood.  No active bleeding. External ears normal. EYES: Conjunctiva clear, EOMI, PERRLA NECK: Trachea midline, non-tender, supple CARDIOVASCULAR: Tachycardic heart rate, Normal rhythm PULMONARY/CHEST: Rales bilaterally - severe scoliosis.  Non-tender. ABDOMINAL: Non-distended, soft, non-tender - no rebound or guarding.  BS normal. NEUROLOGIC: Non-focal, moving all four extremities, no gross sensory or motor deficits. EXTREMITIES: No clubbing, cyanosis, or edema SKIN: Warm, Dry, No erythema, No rash  ED Course  Procedures (including critical care time)  Labs Reviewed  CBC WITH DIFFERENTIAL - Abnormal; Notable for the following:    WBC 16.5 (*)     Neutrophils Relative 90 (*)     Neutro Abs 14.9 (*)     Lymphocytes Relative 4 (*)     All other components within normal limits  BASIC  METABOLIC PANEL - Abnormal; Notable for the following:    CO2 38 (*)     Glucose, Bld 290 (*)     GFR calc non Af Amer 80 (*)     All other components within normal limits  POCT I-STAT TROPONIN I  BLOOD GAS, VENOUS  . Dg Chest Portable 1 View  06/03/2012  *RADIOLOGY REPORT*  Clinical Data: Severe shortness of breath.  PORTABLE CHEST - 1 VIEW  Comparison: 01/06/2010 most recent.  Findings: Severe S-shaped thoracolumbar scoliosis with thoracic curvature to the right.  Cardiomegaly.  Upward displacement of the gastric bubble obscures much of the left hemithorax but the lungs are grossly clear.  No definite effusion or pneumothorax.  IMPRESSION: Chronic changes as described.  Cardiomegaly.  Limited exam without definite acute abnormality.   Original Report Authenticated By: Elsie Stain, M.D.      1. CHF exacerbation   2. Epistaxis   3. Unspecified essential hypertension   4. DIABETES, TYPE 2   5. HYPERTENSION   6. Breast cancer metastasized to bone   7. CHF (congestive heart failure)   8. HYPERLIPIDEMIA   9. CAD   10. SCOLIOSIS   11. Cor pulmonale, chronic   12. Breast cancer       MDM  Meghan Casey is a 78 y.o. female presenting in respiratory distress after episode of epistaxis from nose-picking.  Doubt aspiration, pt sounds volume overloaded - however, no peropheral edema, no JVD. BP has been mildly elevated.  Does not with to be intubated.  Will attempt conservative therapy with BiPAP, add lasix.  Doubt PNA - CXR does not show consolidation or overly overloaded lungs.  Chronic changes.  Labs are fairly unremarkably.  Mild hyperbcarbia and hyperglycemia.    Admit to hospitalist for further care.        Jones Skene, MD 06/06/12 [phone]

## 2012-06-03 NOTE — ED Notes (Signed)
Per EMS pt at [facility] for a nosebleed starting 1130, respiratory distress en route to UC, pt diaphoretic, pale, O2 sats 78% RA placed on NRB sats increased to 92% 6-8 L, possible aspiration of blood, 20 g LAC started by UC,

## 2012-06-03 NOTE — Progress Notes (Signed)
Subjective:    Patient ID: Meghan Casey, female    DOB: Jul 11, 1934, 78 y.o.   MRN: [id number]  HPI This 78 y.o. female presents for evaluation of epistaxis that began this morning at 11:30; has been suffering with persistent nose bleed for past 2-3 hours.  When bleeding from nose didn't stop, her husband brought her in.  On the way here, she developed SOB.  She has multiple chronic medical problems, including COPD on home O2 at 2 LPM.  They increased to 4 LPM on the way here.  PCP is Pearson Grippe.  Maintained on Plavix due to CAD.  Has history of picking at nose with chronic irritation.  Also history of CHF ten years ago requiring hospitalization; has not suffered with swelling in past several days or weeks; denies chest pain, leg swelling, orthopnea.  She has also not suffered with COPD issues in past several days; no recent worsening cough, wheezing, SOB.   Husband reports her home pulse ox runs in the low 90's on home oxygen.  No other sites of bleeding.   Blood sugar this morning 101.    Review of Systems  Constitutional: Negative for fever.  Respiratory: Positive for shortness of breath and stridor. Negative for cough and chest tightness.   Cardiovascular: Negative for chest pain, palpitations and leg swelling.  Gastrointestinal: Negative for nausea and vomiting.  Skin: Negative for color change and rash.  Neurological: Negative for dizziness.  Hematological: Bruises/bleeds easily.   As above.  The patient is unable to provide additional history.   Past Medical History  Diagnosis Date  . Diabetes mellitus, type 2   . Hyperlipidemia   . Hypertension   . Atrial fibrillation   . CAD (coronary artery disease)   . CHF (congestive heart failure)   . Sleep apnea   . Post-polio syndrome   . Scoliosis   . GI bleed   . COPD (chronic obstructive pulmonary disease)   . CHF (congestive heart failure)   . Bone metastases   . Breast cancer metastasized to bone 08/16/2011  . Diverticulosis     . GERD (gastroesophageal reflux disease)   . Asthma   . History of colonic polyps   . Depression   . History of jaundice     Past Surgical History  Procedure Date  . Total abdominal hysterectomy 1985  . Bunionectomy bilateral  . Left elbow surgery   . Mastectomy 2000    right  . Appendectomy 1985  . Cholecystectomy 1985  . Ptca 07/20/2000, 11/13/2003    stents x 2    Prior to Admission medications   Medication Sig Start Date End Date Taking? Authorizing Provider  aspirin 81 MG tablet 2 tablets daily    Yes Historical Provider, MD  clopidogrel (PLAVIX) 75 MG tablet Take 75 mg by mouth daily.     Yes Historical Provider, MD  digoxin (LANOXIN) 0.25 MG tablet 1/2 tablet every am and pm    Yes Historical Provider, MD  diphenhydramine-acetaminophen (TYLENOL PM) 25-500 MG TABS Take 1 tablet by mouth at bedtime as needed.     Yes Historical Provider, MD  Fluticasone-Salmeterol (ADVAIR) 250-50 MCG/DOSE AEPB Inhale 1 puff into the lungs every 12 (twelve) hours.     Yes Historical Provider, MD  glimepiride (AMARYL) 2 MG tablet Take 1.5 tablets daily   Yes Historical Provider, MD  Glucosamine Sulfate 750 MG TABS 750 mg 2 (two) times daily. Tablet once a day   Yes Historical Provider, MD  isosorbide  mononitrate (ISMO,MONOKET) 20 MG tablet Take 30 mg by mouth daily.     Yes Historical Provider, MD  metoprolol (LOPRESSOR) 50 MG tablet Take 50 mg by mouth daily.     Yes Historical Provider, MD  montelukast (SINGULAIR) 10 MG tablet Take 10 mg by mouth as needed.    Yes Historical Provider, MD  Multiple Vitamin (MULTIVITAMIN) capsule Take 1 capsule by mouth daily.     Yes Historical Provider, MD  nitroGLYCERIN (NITROSTAT) 0.4 MG SL tablet Place 0.4 mg under the tongue every 5 (five) minutes as needed.     Yes Historical Provider, MD  oxyCODONE-acetaminophen (PERCOCET) 7.5-325 MG per tablet Take 1 tablet by mouth every 4 (four) hours as needed. Pt takes1 a day   Yes Historical Provider, MD   pantoprazole (PROTONIX) 40 MG tablet Take 40 mg by mouth 2 (two) times daily.    Yes Historical Provider, MD  pravastatin (PRAVACHOL) 80 MG tablet Take 80 mg by mouth daily.     Yes Historical Provider, MD  sitaGLIPtin (JANUVIA) 100 MG tablet Take 100 mg by mouth daily.     Yes Historical Provider, MD  tiotropium (SPIRIVA) 18 MCG inhalation capsule Place 18 mcg into inhaler and inhale daily.     Yes Historical Provider, MD  torsemide (DEMADEX) 20 MG tablet Take 20 mg by mouth daily as needed.     Yes Historical Provider, MD  valsartan (DIOVAN) 160 MG tablet Take 160 mg by mouth daily.     Yes Historical Provider, MD  dexlansoprazole (DEXILANT) 60 MG capsule Take 1 capsule (60 mg total) by mouth daily. 03/27/12 04/26/12  Meryl Dare, MD,FACG  folic acid (FOLVITE) 800 MCG tablet Take 400 mcg by mouth daily.      Historical Provider, MD    Allergies  Allergen Reactions  . Dipyridamole     REACTION: shakes    History   Social History  . Marital Status: Married    Spouse Name: N/A    Number of Children: 4  . Years of Education: N/A   Occupational History  . housewife    Social History Main Topics  . Smoking status: Never Smoker   . Smokeless tobacco: Never Used  . Alcohol Use: No  . Drug Use: No  . Sexually Active: Not on file   Other Topics Concern  . Not on file   Social History Narrative  . No narrative on file    Family History  Problem Relation Age of Onset  . Hypertension Mother   . Cancer Mother     bladder  . Hypercalcemia Mother   . Heart attack Father   . Diabetes Father   . Coronary artery disease Father   . Stroke Maternal Grandfather   . Stroke Paternal Grandmother   . Stroke Paternal Grandfather        Objective:   Physical Exam Blood pressure 151/81, pulse 108, resp. rate 36, SpO2 64.00%. There is no height or weight on file to calculate BMI. Well-developed, scoliotic and kyphotic WF who is awake, alert in respiratory distress.  Responsive and  follows commands.  HEENT: [place]/AT, Sclera and conjunctiva are clear.  Tissues/gauze in left nare saturated but no active bleeding in OP or from gauze in L nare. Posterior pharynx is clear; +tongue with dried residual blood. Neck: Supple without swelling; no thyromegaly. Lungs: poor air movement; prolonged expiratory phase; diffuse rhonchi; no wheezing; +tachypnea at rate of 36.  +acessory muscle use; +retractions.  Loud upper airway sounds.  Rhonchi diffuse. CV:  Sinus tachycardia.   Extremities:  Trace to 1+ pitting edema BLE; pale but warm. Neurological:  CNII-XII intact; moving all extremities; follows commands. Psych: alert x oriented x 3; follows commands.    PROCEDURES:  PT BROUGHT BACK AND TRIAGED EMERGENTLY DUE TO RESPIRATORY DISTRESS AND EPISTAXIS.  PLACED ON 4 LITERS NASAL CANNULA DUE TO PULSE OX OF 61%; PULSE OXIMETRY INCREASED TO 81%. PULSE OXIMETRY SUBSEQUENTLY DECREASED TO 71% AND PLACED ON FACEMASK AT 15 LPM.    IV access 20 gauge, LEFT AC, NS KVO for access.      Assessment & Plan:   1. Hypoxia / Respiratory Failure  2. Epistaxis   3. SOB (shortness of breath)     1.  Hypoxia/Respiratory Failure:  New.  Suspect aspiration of blood from epistaxis with poor ventilation.  To ED via EMS.  Will likely warrant mechanical ventilation due to underlying COPD. 2.  Epistaxis: New.  Duration three hours before evaluation.  Stable with packing of L nare however bleeding site not evaluated in office due to respiratory failure. 3.  COPD severe:  Requiring home oxygen at baseline.  High risk for persistent respiratory failure.

## 2012-06-03 NOTE — H&P (Signed)
[facility] History and Physical  DAMARYS SPEIR NWG:[id number] DOB: 02-29-1934    PCP:   Pearson Grippe, MD   Chief Complaint: Epistaxis and transcient respiratory distress.  HPI: Meghan Casey is an 78 y.o. 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  She still has a tissue on her left nasal passages.  Upon arrival to the ER, she was placed on Bipap.  Her CXR showed no infiltrate, and no CHF, but on exam, she has bilateral rales, so IV lasix was given.  Other work up included leukocytosis with WBC of 16K with Hb 12.8, and BS of 290.  Her renal fx tests were normal.  Because she was in such respiratory distress, although she is much better now, hospitalist was asked to admit her for OBS.  Rewiew of Systems:  Constitutional: Negative for malaise, fever and chills. No significant weight loss or weight gain Eyes: Negative for eye pain, redness and discharge, diplopia, visual changes, or flashes of light. ENMT: Negative for ear pain, hoarseness, nasal congestion, sinus pressure and sore throat. No headaches; tinnitus, drooling, or problem swallowing. Cardiovascular: Negative for chest pain, palpitations, diaphoresis, dyspnea and peripheral edema. ; No orthopnea, PND Respiratory: Negative for cough, hemoptysis, wheezing and stridor. No pleuritic chestpain. Gastrointestinal: Negative for nausea, vomiting, diarrhea, constipation, abdominal pain, melena, blood in stool, hematemesis, jaundice and rectal bleeding.    Genitourinary: Negative for frequency, dysuria, incontinence,flank pain and hematuria; Musculoskeletal: Negative for back pain and neck pain. Negative for swelling and trauma.;  Skin: . Negative for pruritus, rash, abrasions, bruising and skin lesion.; ulcerations Neuro: Negative for headache, lightheadedness and neck stiffness. Negative for  weakness, altered level of consciousness , altered mental status, extremity weakness, burning feet, involuntary movement, seizure and syncope.  Psych: negative for anxiety, depression, insomnia, tearfulness, panic attacks, hallucinations, paranoia, suicidal or homicidal ideation   Past Medical History  Diagnosis Date  . Diabetes mellitus, type 2   . Hyperlipidemia   . Hypertension   . Atrial fibrillation   . CAD (coronary artery disease)   . CHF (congestive heart failure)   . Sleep apnea   . Post-polio syndrome   . Scoliosis   . GI bleed   . COPD (chronic obstructive pulmonary disease)   . CHF (congestive heart failure)   . Bone metastases   . Breast cancer metastasized to bone 08/16/2011  . Diverticulosis   . GERD (gastroesophageal reflux disease)   . Asthma   . History of colonic polyps   . Depression   . History of jaundice     Past Surgical History  Procedure Date  . Total abdominal hysterectomy 1985  . Bunionectomy bilateral  . Left elbow surgery   . Mastectomy 2000    right  . Appendectomy 1985  . Cholecystectomy 1985  . Ptca 07/20/2000, 11/13/2003    stents x 2    Medications:  HOME MEDS: Prior to Admission medications   Medication Sig Start Date End Date Taking? Authorizing Provider  aspirin 81 MG tablet 2 tablets daily   Yes Historical Provider, MD  clopidogrel (PLAVIX) 75 MG tablet Take 75 mg by mouth daily.     Yes Historical Provider, MD  digoxin (LANOXIN) 0.25 MG tablet 1/2 tablet every am and pm    Yes Historical Provider, MD  diphenhydramine-acetaminophen (TYLENOL PM) 25-500 MG TABS Take 1 tablet by mouth at  bedtime as needed.     Yes Historical Provider, MD  Fluticasone-Salmeterol (ADVAIR) 250-50 MCG/DOSE AEPB Inhale 1 puff into the lungs every 12 (twelve) hours.     Yes Historical Provider, MD  folic acid (FOLVITE) 800 MCG tablet Take 400 mcg by mouth daily.     Yes Historical Provider, MD  glimepiride (AMARYL) 2 MG tablet Take 1.5 tablets daily   Yes  Historical Provider, MD  Glucosamine Sulfate 750 MG TABS 750 mg 2 (two) times daily. Tablet once a day   Yes Historical Provider, MD  isosorbide mononitrate (ISMO,MONOKET) 20 MG tablet Take 30 mg by mouth daily.     Yes Historical Provider, MD  montelukast (SINGULAIR) 10 MG tablet Take 10 mg by mouth as needed.    Yes Historical Provider, MD  Multiple Vitamin (MULTIVITAMIN) capsule Take 1 capsule by mouth daily.     Yes Historical Provider, MD  nitroGLYCERIN (NITROSTAT) 0.4 MG SL tablet Place 0.4 mg under the tongue every 5 (five) minutes as needed.     Yes Historical Provider, MD  oxyCODONE-acetaminophen (PERCOCET) 7.5-325 MG per tablet Take 1 tablet by mouth every 4 (four) hours as needed. Pt takes1 a day   Yes Historical Provider, MD  pantoprazole (PROTONIX) 40 MG tablet Take 40 mg by mouth 2 (two) times daily.    Yes Historical Provider, MD  pravastatin (PRAVACHOL) 80 MG tablet Take 80 mg by mouth daily.     Yes Historical Provider, MD  sitaGLIPtin (JANUVIA) 100 MG tablet Take 100 mg by mouth daily.     Yes Historical Provider, MD  tiotropium (SPIRIVA) 18 MCG inhalation capsule Place 18 mcg into inhaler and inhale daily.     Yes Historical Provider, MD  TOPROL XL 50 MG 24 hr tablet Take 1 tablet by mouth daily. 02/29/12  Yes Historical Provider, MD  torsemide (DEMADEX) 20 MG tablet Take 20 mg by mouth daily as needed.     Yes Historical Provider, MD  valsartan (DIOVAN) 160 MG tablet Take 160 mg by mouth daily.     Yes Historical Provider, MD  dexlansoprazole (DEXILANT) 60 MG capsule Take 1 capsule (60 mg total) by mouth daily. 03/27/12 04/26/12  Meryl Dare, MD,FACG     Allergies:  Allergies  Allergen Reactions  . Dipyridamole     REACTION: shakes    Social History:   reports that she has never smoked. She has never used smokeless tobacco. She reports that she does not drink alcohol or use illicit drugs.  Family History: Family History  Problem Relation Age of Onset  .  Hypertension Mother   . Cancer Mother     bladder  . Hypercalcemia Mother   . Heart attack Father   . Diabetes Father   . Coronary artery disease Father   . Stroke Maternal Grandfather   . Stroke Paternal Grandmother   . Stroke Paternal Grandfather      Physical Exam: Filed Vitals:   06/03/12 1829 06/03/12 1900 06/03/12 1915 06/03/12 1940  BP: 108/57 161/86 148/84   Pulse: 63 69 69 69  Resp: 16     SpO2: 99% 100% 100% 100%   Blood pressure 148/84, pulse 69, resp. rate 16, SpO2 100.00%.  GEN:  Pleasant patient lying in the stretcher in no acute distress; cooperative with exam. Still in bipap comfortably PSYCH:  alert and oriented x4; does not appear anxious or depressed; affect is appropriate. HEENT: Mucous membranes pink and anicteric; PERRLA; EOM intact; no cervical lymphadenopathy nor thyromegaly or  carotid bruit; no JVD; There were no stridor. Neck is very supple. Breasts:: Not examined CHEST WALL: No tenderness CHEST: Normal respiration, clear to auscultation bilaterally.  HEART: Regular rate and rhythm.  There are no murmur, rub, or gallops.   BACK: No kyphosis or scoliosis; no CVA tenderness ABDOMEN: soft and non-tender; no masses, no organomegaly, normal abdominal bowel sounds; no pannus; no intertriginous candida. There is no rebound and no distention. Rectal Exam: Not done EXTREMITIES: No bone or joint deformity; age-appropriate arthropathy of the hands and knees; no edema; no ulcerations.  There is no calf tenderness. Genitalia: not examined PULSES: 2+ and symmetric SKIN: Normal hydration no rash or ulceration CNS: Cranial nerves 2-12 grossly intact no focal lateralizing neurologic deficit.  Speech is fluent; uvula elevated with phonation, facial symmetry and tongue midline. DTR are normal bilaterally, cerebella exam is intact, barbinski is negative and strengths are equaled bilaterally.  No sensory loss.   Labs on Admission:  Basic Metabolic Panel:  Lab 06/03/12  1603  NA 142  K 4.2  CL 100  CO2 38*  GLUCOSE 290*  BUN 18  CREATININE 0.75  CALCIUM 10.2  MG --  PHOS --   Liver Function Tests: No results found for this basename: AST:5,ALT:5,ALKPHOS:5,BILITOT:5,PROT:5,ALBUMIN:5 in the last 168 hours No results found for this basename: LIPASE:5,AMYLASE:5 in the last 168 hours No results found for this basename: AMMONIA:5 in the last 168 hours CBC:  Lab 06/03/12 1603  WBC 16.5*  NEUTROABS 14.9*  HGB 12.8  HCT 42.4  MCV 95.9  PLT 212   Cardiac Enzymes: No results found for this basename: CKTOTAL:5,CKMB:5,CKMBINDEX:5,TROPONINI:5 in the last 168 hours  CBG: No results found for this basename: GLUCAP:5 in the last 168 hours   Radiological Exams on Admission: Dg Chest Portable 1 View  06/03/2012  *RADIOLOGY REPORT*  Clinical Data: Severe shortness of breath.  PORTABLE CHEST - 1 VIEW  Comparison: 01/06/2010 most recent.  Findings: Severe S-shaped thoracolumbar scoliosis with thoracic curvature to the right.  Cardiomegaly.  Upward displacement of the gastric bubble obscures much of the left hemithorax but the lungs are grossly clear.  No definite effusion or pneumothorax.  IMPRESSION: Chronic changes as described.  Cardiomegaly.  Limited exam without definite acute abnormality.   Original Report Authenticated By: Elsie Stain, M.D.     Assessment/Plan Present on Admission:  .Epistaxis .DIABETES, TYPE 2 .Breast cancer metastasized to bone .CHF (congestive heart failure) .HYPERTENSION   PLAN:  78 yo with COPD, Hx of CHF, CAD, DM admitted for obs since she came in respiratory distress after having a severe epistaxis on Plavix.  She is stable now, and no other intervention is needed nor indicated.  She received IV lasix, and I will simply put her back on her meds including Plavix and ASA.  She is on Bipap, which we will discontinue later if she continue to be stable.  She is DM and will put her on carb modified diet along with SSI and her oral  hyperglycemic agent.  The tissue in her left nostril will be taken out tomorrow.  I had a chance to discuss code status and her husband confirmed that she has been and is DNR/DNI.  We will honor her wishes.  Other plans as per orders.  Code Status: DNR.   Houston Siren, MD. [facility] Pager [phone] 7pm to 7am.  06/03/2012, 8:43 PM

## 2012-06-03 NOTE — ED Notes (Signed)
Spoke to hospitalist to clarify oxygen order.  Stated that we may remove from bipap and put patient on a ventimask for the floor.  Joseph-RN notified of change.

## 2012-06-04 DIAGNOSIS — E119 Type 2 diabetes mellitus without complications: Secondary | ICD-10-CM

## 2012-06-04 DIAGNOSIS — E785 Hyperlipidemia, unspecified: Secondary | ICD-10-CM

## 2012-06-04 LAB — GLUCOSE, CAPILLARY: Glucose-Capillary: 124 mg/dL — ABNORMAL HIGH (ref 70–99)

## 2012-06-04 LAB — CBC
MCHC: 30.5 g/dL (ref 30.0–36.0)
RDW: 13.1 % (ref 11.5–15.5)
WBC: 13 10*3/uL — ABNORMAL HIGH (ref 4.0–10.5)

## 2012-06-04 LAB — BASIC METABOLIC PANEL
BUN: 32 mg/dL — ABNORMAL HIGH (ref 6–23)
Chloride: 96 mEq/L (ref 96–112)
Creatinine, Ser: 0.68 mg/dL (ref 0.50–1.10)
GFR calc Af Amer: >90 mL/min (ref >90)
GFR calc non Af Amer: 83 mL/min — ABNORMAL LOW (ref >90)
Potassium: 3.4 mEq/L — ABNORMAL LOW (ref 3.5–5.1)

## 2012-06-04 MED ORDER — SODIUM CHLORIDE 0.9 % IV SOLN: INTRAVENOUS | Status: DC

## 2012-06-04 MED ORDER — SODIUM CHLORIDE 0.9 % IV BOLUS (SEPSIS)
500.0000 mL | Freq: Once | INTRAVENOUS | Status: AC
  Administered 2012-06-04: 500 mL via INTRAVENOUS

## 2012-06-04 NOTE — Discharge Summary (Signed)
Physician Discharge Summary  SABINE TENENBAUM WUJ:[id number] DOB: 01-11-1934 DOA: 06/03/2012  PCP: Pearson Grippe, MD  Admit date: 06/03/2012 Discharge date: 06/04/2012  Discharge Diagnoses:  Principal Problem:  *Epistaxis Active Problems:  DIABETES, TYPE 2  HYPERTENSION  Breast cancer metastasized to bone  CHF (congestive heart failure)   Discharge Condition: Stable for DC  Disposition:  Follow-up Information    Follow up with Pearson Grippe, MD in 2 weeks. (hospital follow up)    Contact information:   [address] [place] [address] [phone]          Diet: Heart healthy low carbohydrate diet Wt Readings from Last 3 Encounters:  06/04/12 66.9 kg (147 lb 7.8 oz)  03/27/12 66.225 kg (146 lb)  08/16/11 66.271 kg (146 lb 1.6 oz)    History of present illness:  78 y.o. 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 She still has a tissue on her left nasal passages. Upon arrival to the ER, she was placed on Bipap. Her CXR showed no infiltrate, and no CHF, but on exam, she has bilateral rales, so IV lasix was given. Other work up included leukocytosis with WBC of 16K with Hb 12.8, and BS of 290. Her renal fx tests were normal. Because she was in such respiratory distress, although she is much better now, hospitalist was asked to admit her for OBS.   Hospital Course:  Epistaxis: She was admitted to the hospital was started on oxygen, her hemoglobin was monitored which remained stable her epistaxis resolved. She was initially admitted because of significant desaturation. She was put on a Venturi mask initially and she was changed to nasal cannula and her saturations remained always above 93% on the same amount of oxygen she is at home. She was discharged in stable condition she will continue her Plavix. The most likely  cause for this is a patient relates she was picking her nose throughout the day the day before.  Discharge Exam: Filed Vitals:   06/04/12 0500  BP: 135/67  Pulse: 69  Temp: 97.9 F (36.6 C)  Resp: 28   Filed Vitals:   06/03/12 2045 06/03/12 2235 06/03/12 2337 06/04/12 0500  BP: 151/86 134/61  135/67  Pulse: 73 73 84 69  Temp:  98.8 F (37.1 C)  97.9 F (36.6 C)  TempSrc:  Oral  Oral  Resp:  36 16 28  Height:  4\' 9"  (1.448 m)    Weight:  63.9 kg (140 lb 14 oz)  66.9 kg (147 lb 7.8 oz)  SpO2: 99% 100% 100%    General: Awake alert and oriented x3 Cardiovascular: Regular rate and rhythm Respiratory: Good air movement clear to auscultation  Discharge Instructions  Discharge Orders    Future Appointments: Provider: Department: Dept Phone: Center:   08/16/2012 11:00 AM Radene Gunning [facility] [phone] None   08/16/2012 11:30 AM Victorino December, MD [facility] [phone] None     Future Orders Please Complete By Expires   Diet - low sodium heart healthy      Increase activity slowly        Medication List  As of 06/04/2012  8:22 AM   TAKE these medications         aspirin 81 MG tablet   2 tablets daily      clopidogrel 75 MG tablet  Commonly known as: PLAVIX   Take 75 mg by mouth daily.      dexlansoprazole 60 MG capsule   Commonly known as: DEXILANT   Take 1 capsule (60 mg total) by mouth daily.      digoxin 0.25 MG tablet   Commonly known as: LANOXIN   1/2 tablet every am and pm      diphenhydramine-acetaminophen 25-500 MG Tabs   Commonly known as: TYLENOL PM   Take 1 tablet by mouth at bedtime as needed.      Fluticasone-Salmeterol 250-50 MCG/DOSE Aepb   Commonly known as: ADVAIR   Inhale 1 puff into the lungs every 12 (twelve) hours.      folic acid 800 MCG tablet   Commonly known as: FOLVITE   Take 400 mcg by mouth daily.      glimepiride 2 MG tablet   Commonly known as: AMARYL   Take 1.5 tablets daily      Glucosamine Sulfate  750 MG Tabs   750 mg 2 (two) times daily. Tablet once a day      isosorbide mononitrate 20 MG tablet   Commonly known as: ISMO,MONOKET   Take 30 mg by mouth daily.      montelukast 10 MG tablet   Commonly known as: SINGULAIR   Take 10 mg by mouth as needed.      multivitamin capsule   Take 1 capsule by mouth daily.      nitroGLYCERIN 0.4 MG SL tablet   Commonly known as: NITROSTAT   Place 0.4 mg under the tongue every 5 (five) minutes as needed.      oxyCODONE-acetaminophen 7.5-325 MG per tablet   Commonly known as: PERCOCET   Take 1 tablet by mouth every 4 (four) hours as needed. Pt takes1 a day      pantoprazole 40 MG tablet   Commonly known as: PROTONIX   Take 40 mg by mouth 2 (two) times daily.      pravastatin 80 MG tablet   Commonly known as: PRAVACHOL   Take 80 mg by mouth daily.      sitaGLIPtin 100 MG tablet   Commonly known as: JANUVIA   Take 100 mg by mouth daily.      tiotropium 18 MCG inhalation capsule   Commonly known as: SPIRIVA   Place 18 mcg into inhaler and inhale daily.      TOPROL XL 50 MG 24 hr tablet   Generic drug: metoprolol succinate   Take 1 tablet by mouth daily.      torsemide 20 MG tablet   Commonly known as: DEMADEX   Take 20 mg by mouth daily as needed.      valsartan 160 MG tablet   Commonly known as: DIOVAN   Take 160 mg by mouth daily.              The results of significant diagnostics from this hospitalization (including imaging, microbiology, ancillary and laboratory) are listed below for reference.    Significant Diagnostic Studies: Dg Chest Portable 1 View  06/03/2012  *RADIOLOGY REPORT*  Clinical Data: Severe shortness of breath.  PORTABLE CHEST - 1 VIEW  Comparison: 01/06/2010 most recent.  Findings: Severe S-shaped thoracolumbar scoliosis with thoracic curvature to the right.  Cardiomegaly.  Upward displacement of the gastric bubble obscures much of the left hemithorax but the lungs are grossly clear.  No definite  effusion or pneumothorax.  IMPRESSION: Chronic changes as described.  Cardiomegaly.  Limited exam without definite  acute abnormality.   Original Report Authenticated By: Elsie Stain, M.D.     Microbiology: No results found for this or any previous visit (from the past 240 hour(s)).   Labs: Basic Metabolic Panel:  Lab 06/04/12 1610 06/03/12 1603  NA 144 142  K 3.4* 4.2  CL 96 100  CO2 42* 38*  GLUCOSE 138* 290*  BUN 32* 18  CREATININE 0.68 0.75  CALCIUM 10.3 10.2  MG -- --  PHOS -- --   Liver Function Tests: No results found for this basename: AST:5,ALT:5,ALKPHOS:5,BILITOT:5,PROT:5,ALBUMIN:5 in the last 168 hours No results found for this basename: LIPASE:5,AMYLASE:5 in the last 168 hours No results found for this basename: AMMONIA:5 in the last 168 hours CBC:  Lab 06/04/12 0500 06/03/12 1603  WBC 13.0* 16.5*  NEUTROABS -- 14.9*  HGB 12.2 12.8  HCT 40.0 42.4  MCV 94.8 95.9  PLT 160 212   Cardiac Enzymes: No results found for this basename: CKTOTAL:5,CKMB:5,CKMBINDEX:5,TROPONINI:5 in the last 168 hours BNP: No components found with this basename: POCBNP:5 CBG:  Lab 06/04/12 0727 06/03/12 2247  GLUCAP 124* 154*    Time coordinating discharge: 30 minutes  Signed:  Marinda Elk  [facility] 06/04/2012, 8:22 AM

## 2012-06-04 NOTE — Progress Notes (Signed)
06/04/12 0800 Lab called with CO2 abnormal at 42.

## 2012-06-04 NOTE — Progress Notes (Signed)
06/04/12 Patient to be discharged home today. IV site removed,Discharge instructions reviewed with the family.

## 2012-06-06 ENCOUNTER — Encounter: Payer: Self-pay | Admitting: Family Medicine

## 2012-06-07 NOTE — Progress Notes (Signed)
Reviewed and agree.

## 2012-06-11 ENCOUNTER — Emergency Department (HOSPITAL_COMMUNITY)
Admission: EM | Admit: 2012-06-11 | Discharge: 2012-06-12 | Disposition: A | Discharge status: Home / Self Care | Payer: Medicare Other | Attending: Emergency Medicine | Admitting: Emergency Medicine

## 2012-06-11 DIAGNOSIS — Z853 Personal history of malignant neoplasm of breast: Secondary | ICD-10-CM | POA: Insufficient documentation

## 2012-06-11 DIAGNOSIS — I10 Essential (primary) hypertension: Secondary | ICD-10-CM | POA: Insufficient documentation

## 2012-06-11 DIAGNOSIS — G4730 Sleep apnea, unspecified: Secondary | ICD-10-CM | POA: Insufficient documentation

## 2012-06-11 DIAGNOSIS — E785 Hyperlipidemia, unspecified: Secondary | ICD-10-CM | POA: Insufficient documentation

## 2012-06-11 DIAGNOSIS — J4489 Other specified chronic obstructive pulmonary disease: Secondary | ICD-10-CM | POA: Insufficient documentation

## 2012-06-11 DIAGNOSIS — Z9861 Coronary angioplasty status: Secondary | ICD-10-CM | POA: Insufficient documentation

## 2012-06-11 DIAGNOSIS — J449 Chronic obstructive pulmonary disease, unspecified: Secondary | ICD-10-CM | POA: Insufficient documentation

## 2012-06-11 DIAGNOSIS — R040 Epistaxis: Secondary | ICD-10-CM

## 2012-06-11 DIAGNOSIS — I4891 Unspecified atrial fibrillation: Secondary | ICD-10-CM | POA: Insufficient documentation

## 2012-06-11 DIAGNOSIS — I2510 Atherosclerotic heart disease of native coronary artery without angina pectoris: Secondary | ICD-10-CM | POA: Insufficient documentation

## 2012-06-11 DIAGNOSIS — F329 Major depressive disorder, single episode, unspecified: Secondary | ICD-10-CM | POA: Insufficient documentation

## 2012-06-11 DIAGNOSIS — F0390 Unspecified dementia without behavioral disturbance: Secondary | ICD-10-CM | POA: Insufficient documentation

## 2012-06-11 DIAGNOSIS — Z79899 Other long term (current) drug therapy: Secondary | ICD-10-CM | POA: Insufficient documentation

## 2012-06-11 DIAGNOSIS — I509 Heart failure, unspecified: Secondary | ICD-10-CM | POA: Insufficient documentation

## 2012-06-11 DIAGNOSIS — K219 Gastro-esophageal reflux disease without esophagitis: Secondary | ICD-10-CM | POA: Insufficient documentation

## 2012-06-11 DIAGNOSIS — Z7902 Long term (current) use of antithrombotics/antiplatelets: Secondary | ICD-10-CM | POA: Insufficient documentation

## 2012-06-11 DIAGNOSIS — E119 Type 2 diabetes mellitus without complications: Secondary | ICD-10-CM | POA: Insufficient documentation

## 2012-06-11 DIAGNOSIS — Z85830 Personal history of malignant neoplasm of bone: Secondary | ICD-10-CM | POA: Insufficient documentation

## 2012-06-11 DIAGNOSIS — F3289 Other specified depressive episodes: Secondary | ICD-10-CM | POA: Insufficient documentation

## 2012-06-11 NOTE — ED Notes (Signed)
ZOX:[id number]<AV> Expected date:06/11/12<BR> Expected time:11:02 PM<BR> Means of arrival:Ambulance<BR> Comments:<BR> RM: 18, Epistaxis

## 2012-06-11 NOTE — ED Notes (Signed)
As per EMS pt nosebleed started while in the restroom.EMS admnistered 2 sparys of afrin in left nare. Bleeding controled before arrival to hospital.VSS.pwd.

## 2012-06-12 LAB — BASIC METABOLIC PANEL
Chloride: 102 mEq/L (ref 96–112)
GFR calc Af Amer: >90 mL/min (ref >90)
GFR calc non Af Amer: 87 mL/min — ABNORMAL LOW (ref >90)
Potassium: 3.9 mEq/L (ref 3.5–5.1)
Sodium: 140 mEq/L (ref 135–145)

## 2012-06-12 LAB — CBC
MCHC: 31.1 g/dL (ref 30.0–36.0)
Platelets: 232 10*3/uL (ref 150–400)
RDW: 12.9 % (ref 11.5–15.5)
WBC: 9.6 10*3/uL (ref 4.0–10.5)

## 2012-06-12 NOTE — ED Notes (Signed)
PA at bedside.

## 2012-06-12 NOTE — ED Provider Notes (Signed)
Medical screening examination/treatment/procedure(s) were performed by non-physician practitioner and as supervising physician I was immediately available for consultation/collaboration.  Deshanda Molitor, MD 06/12/12 2313 

## 2012-06-12 NOTE — ED Provider Notes (Signed)
History     CSN: [id number]  Arrival date & time 06/11/12  2324   First MD Initiated Contact with Patient 06/12/12 [phone]      Chief Complaint  Patient presents with  . Epistaxis    (Consider location/radiation/quality/duration/timing/severity/associated sxs/prior treatment) HPI History provided by pt and her husband.  Per patient's husband, bleeding started at approx 9:30 and lasted for 45 minutes, despite pinching her nose.  She was spitting out blood as well.  She picks at her nose.  Pt reports that she is otherwise feeling well.  Denies CP, SOB, lightheadedness and syncope.  Per prior chart, pt admitted 8/24 for epistaxis with respiratory distress.  Pt is anticoagulated w/ plavix and aspirin.   Past Medical History  Diagnosis Date  . Diabetes mellitus, type 2   . Hyperlipidemia   . Hypertension   . Atrial fibrillation   . CAD (coronary artery disease)   . CHF (congestive heart failure)   . Sleep apnea   . Post-polio syndrome   . Scoliosis   . GI bleed   . COPD (chronic obstructive pulmonary disease)   . CHF (congestive heart failure)   . Bone metastases   . Breast cancer metastasized to bone 08/16/2011  . Diverticulosis   . GERD (gastroesophageal reflux disease)   . Asthma   . History of colonic polyps   . Depression   . History of jaundice   . Dementia     Past Surgical History  Procedure Date  . Total abdominal hysterectomy 1985  . Bunionectomy bilateral  . Left elbow surgery   . Mastectomy 2000    right  . Appendectomy 1985  . Cholecystectomy 1985  . Ptca 07/20/2000, 11/13/2003    stents x 2    Family History  Problem Relation Age of Onset  . Hypertension Mother   . Cancer Mother     bladder  . Hypercalcemia Mother   . Heart attack Father   . Diabetes Father   . Coronary artery disease Father   . Stroke Maternal Grandfather   . Stroke Paternal Grandmother   . Stroke Paternal Grandfather     History  Substance Use Topics  . Smoking status: Never  Smoker   . Smokeless tobacco: Never Used  . Alcohol Use: No    OB History    Grav Para Term Preterm Abortions TAB SAB Ect Mult Living                  Review of Systems  All other systems reviewed and are negative.    Allergies  Dipyridamole; Milk-related compounds; and Peanut-containing drug products  Home Medications   Current Outpatient Rx  Name Route Sig Dispense Refill  . ASPIRIN 81 MG PO TABS  2 tablets daily    . CALCIUM CARBONATE ANTACID 500 MG PO CHEW Oral Chew 2 tablets by mouth at bedtime.    . CLOPIDOGREL BISULFATE 75 MG PO TABS Oral Take 75 mg by mouth daily.      Marland Kitchen DIGOXIN 0.25 MG PO TABS Oral Take 0.25 mg by mouth every morning.     Marland Kitchen DIPHENHYDRAMINE-APAP (SLEEP) 25-500 MG PO TABS Oral Take 1 tablet by mouth at bedtime.     Marland Kitchen FLUTICASONE-SALMETEROL 250-50 MCG/DOSE IN AEPB Inhalation Inhale 1 puff into the lungs every 12 (twelve) hours.      Marland Kitchen FOLIC ACID 800 MCG PO TABS Oral Take 400 mcg by mouth at bedtime.     Marland Kitchen GLIMEPIRIDE 2 MG PO TABS  3 mg. Take 1 and one half tablets with supper    . GLUCOSAMINE SULFATE 750 MG PO TABS Oral Take 750 mg by mouth 2 (two) times daily.     . ISOSORBIDE MONONITRATE ER 30 MG PO TB24 Oral Take 30 mg by mouth every morning.    Marland Kitchen MONTELUKAST SODIUM 10 MG PO TABS Oral Take 10 mg by mouth daily as needed. For allergies    . MULTIVITAMINS PO CAPS Oral Take 1 capsule by mouth daily at 12 noon.     Marland Kitchen NAPHAZOLINE-PHENIRAMINE 0.027-0.315 % OP SOLN Ophthalmic Apply 1 drop to eye 2 (two) times daily as needed. For irritation    . NITROGLYCERIN 0.4 MG SL SUBL Sublingual Place 0.4 mg under the tongue every 5 (five) minutes x 3 doses as needed. For chest pain    . OXYCODONE-ACETAMINOPHEN 7.5-325 MG PO TABS Oral Take 1 tablet by mouth every 4 (four) hours as needed. For pain Patient takes once daily in the morning    . PANTOPRAZOLE SODIUM 40 MG PO TBEC Oral Take 40 mg by mouth 2 (two) times daily.     Marland Kitchen PRAVASTATIN SODIUM 80 MG PO TABS Oral Take  80 mg by mouth at bedtime.     Marland Kitchen SITAGLIPTIN PHOSPHATE 100 MG PO TABS Oral Take 100 mg by mouth every morning.     Marland Kitchen TIOTROPIUM BROMIDE MONOHYDRATE 18 MCG IN CAPS Inhalation Place 18 mcg into inhaler and inhale daily.      . TOPROL XL 50 MG PO TB24 Oral Take 1 tablet by mouth every morning.     . TORSEMIDE 20 MG PO TABS Oral Take 20 mg by mouth daily as needed. For fluid retention    . VALSARTAN 160 MG PO TABS Oral Take 80 mg by mouth 2 (two) times daily.       BP 147/63  Pulse 61  Temp 98.6 F (37 C) (Oral)  Resp 24  SpO2 100%  Physical Exam  Nursing note and vitals reviewed. Constitutional: She is oriented to person, place, and time. She appears well-developed and well-nourished. No distress.  HENT:  Head: Normocephalic and atraumatic.       Dried blood and possibly <1cm superficial lac left kiesselbach's plexus.  No active epistaxis.  Right nostril clear.  No blood posterior pharynx.   Eyes:       Normal appearance  Neck: Normal range of motion.  Cardiovascular: Normal rate and regular rhythm.   Pulmonary/Chest: Effort normal and breath sounds normal. No respiratory distress.  Musculoskeletal: Normal range of motion.  Neurological: She is alert and oriented to person, place, and time.  Skin: Skin is warm and dry. No rash noted.  Psychiatric: She has a normal mood and affect. Her behavior is normal.    ED Course  Procedures (including critical care time)  Labs Reviewed  CBC - Abnormal; Notable for the following:    RBC 3.45 (*)     Hemoglobin 10.1 (*)     HCT 32.5 (*)     All other components within normal limits  BASIC METABOLIC PANEL - Abnormal; Notable for the following:    CO2 33 (*)     Glucose, Bld 176 (*)     GFR calc non Af Amer 87 (*)     All other components within normal limits   No results found.   1. Epistaxis       MDM  78yo demented F, anticoagulated w/ plavix, presents w/ c/o epistaxis.  No active  bleeding on exam.  Nursing staff ordered labs  and hgb dropped from 12.2 on 06/04/12 to 10.1 today.   However, VS are unremarkable and pt has no symptoms concerning for anemia.  Discussed w/ Dr. Weldon Inches. Pt referred to ENT for persistent/recurrent sx.  Return precautions and instructions for stopping future nosebleed discussed.        Otilio Miu, [place] 06/12/12 [phone]

## 2012-08-16 ENCOUNTER — Other Ambulatory Visit (HOSPITAL_BASED_OUTPATIENT_CLINIC_OR_DEPARTMENT_OTHER): Payer: Medicare Other | Admitting: Lab

## 2012-08-16 ENCOUNTER — Telehealth: Payer: Self-pay | Admitting: Oncology

## 2012-08-16 ENCOUNTER — Encounter: Payer: Self-pay | Admitting: Oncology

## 2012-08-16 ENCOUNTER — Ambulatory Visit (HOSPITAL_BASED_OUTPATIENT_CLINIC_OR_DEPARTMENT_OTHER): Payer: Medicare Other | Admitting: Oncology

## 2012-08-16 VITALS — BP 148/72 | HR 62 | Temp 97.9°F | Resp 20 | Ht <= 58 in | Wt 142.4 lb

## 2012-08-16 DIAGNOSIS — Z85830 Personal history of malignant neoplasm of bone: Secondary | ICD-10-CM

## 2012-08-16 DIAGNOSIS — C7951 Secondary malignant neoplasm of bone: Secondary | ICD-10-CM

## 2012-08-16 DIAGNOSIS — Z853 Personal history of malignant neoplasm of breast: Secondary | ICD-10-CM

## 2012-08-16 DIAGNOSIS — C50919 Malignant neoplasm of unspecified site of unspecified female breast: Secondary | ICD-10-CM

## 2012-08-16 LAB — CBC WITH DIFFERENTIAL/PLATELET
BASO%: 0.4 % (ref 0.0–2.0)
Basophils Absolute: 0 10*3/uL (ref 0.0–0.1)
EOS%: 1.1 % (ref 0.0–7.0)
HGB: 11.3 g/dL — ABNORMAL LOW (ref 11.6–15.9)
MCH: 28.6 pg (ref 25.1–34.0)
MCHC: 32.6 g/dL (ref 31.5–36.0)
MCV: 87.7 fL (ref 79.5–101.0)
MONO%: 7.9 % (ref 0.0–14.0)
RBC: 3.95 10*6/uL (ref 3.70–5.45)
RDW: 14.7 % — ABNORMAL HIGH (ref 11.2–14.5)
lymph#: 1.1 10*3/uL (ref 0.9–3.3)

## 2012-08-16 LAB — COMPREHENSIVE METABOLIC PANEL (CC13)
ALT: 12 U/L (ref 0–55)
AST: 11 U/L (ref 5–34)
Albumin: 3.4 g/dL — ABNORMAL LOW (ref 3.5–5.0)
Alkaline Phosphatase: 48 U/L (ref 40–150)
BUN: 17 mg/dL (ref 7.0–26.0)
Potassium: 3.9 mEq/L (ref 3.5–5.1)

## 2012-08-16 NOTE — Telephone Encounter (Signed)
gve the pt her nov 2014 appt calendar along with the mammo appt in dec at [facility]

## 2012-08-16 NOTE — Patient Instructions (Addendum)
Doing well.  No sign of recurrence.  I have ordered your mammogram.  We will see you back in one year.

## 2012-08-16 NOTE — Progress Notes (Signed)
OFFICE PROGRESS NOTE  CC  Meghan Grippe, MD [address] [place] [address]  DIAGNOSIS: 78 y/o female with h/o metastatic breast carcinoma  PRIOR THERAPY: 1. Status post aromatase inhibitor consisting of Femara, started in 01/2000 and ended in 03/2005. 2. Tamoxifen 20mg  07/1999-07/2000.  CURRENT THERAPY:Observation  INTERVAL HISTORY: Meghan Casey 78 y.o. female returns for follow up.  She has been doing well.  She hasn't noticed any changes in her breasts and is without any bony pain.  She is doing well and w/o questions or concerns.    MEDICAL HISTORY: Past Medical History  Diagnosis Date  . Diabetes mellitus, type 2   . Hyperlipidemia   . Hypertension   . Atrial fibrillation   . CAD (coronary artery disease)   . CHF (congestive heart failure)   . Sleep apnea   . Post-polio syndrome   . Scoliosis   . GI bleed   . COPD (chronic obstructive pulmonary disease)   . CHF (congestive heart failure)   . Bone metastases   . Breast cancer metastasized to bone 08/16/2011  . Diverticulosis   . GERD (gastroesophageal reflux disease)   . Asthma   . History of colonic polyps   . Depression   . History of jaundice   . Dementia     ALLERGIES:  is allergic to dipyridamole; milk-related compounds; and peanut-containing drug products.  MEDICATIONS:  Current Outpatient Prescriptions  Medication Sig Dispense Refill  . aspirin 81 MG tablet 2 tablets daily      . calcium carbonate (TUMS - DOSED IN MG ELEMENTAL CALCIUM) 500 MG chewable tablet Chew 2 tablets by mouth at bedtime.      . clopidogrel (PLAVIX) 75 MG tablet Take 75 mg by mouth daily.        . digoxin (LANOXIN) 0.25 MG tablet Take 0.25 mg by mouth every morning.       . diphenhydramine-acetaminophen (TYLENOL PM) 25-500 MG TABS Take 1 tablet by mouth at bedtime.       . Fluticasone-Salmeterol (ADVAIR) 250-50 MCG/DOSE AEPB Inhale 1 puff into the lungs every 12 (twelve) hours.        . folic acid (FOLVITE)  800 MCG tablet Take 400 mcg by mouth at bedtime.       Marland Kitchen glimepiride (AMARYL) 2 MG tablet 3 mg. Take 1 and one half tablets with supper      . Glucosamine Sulfate 750 MG TABS Take 750 mg by mouth 2 (two) times daily.       . isosorbide mononitrate (IMDUR) 30 MG 24 hr tablet Take 30 mg by mouth every morning.      . montelukast (SINGULAIR) 10 MG tablet Take 10 mg by mouth daily as needed. For allergies      . Multiple Vitamin (MULTIVITAMIN) capsule Take 1 capsule by mouth daily at 12 noon.       March Rummage (EQ EYE ALLERGY RELIEF) 0.027-0.315 % SOLN Apply 1 drop to eye 2 (two) times daily as needed. For irritation      . nitroGLYCERIN (NITROSTAT) 0.4 MG SL tablet Place 0.4 mg under the tongue every 5 (five) minutes x 3 doses as needed. For chest pain      . oxyCODONE-acetaminophen (PERCOCET) 7.5-325 MG per tablet Take 1 tablet by mouth every 4 (four) hours as needed. For pain Patient takes once daily in the morning      . pantoprazole (PROTONIX) 40 MG tablet Take 40 mg by mouth 2 (two) times daily.       Marland Kitchen  pravastatin (PRAVACHOL) 80 MG tablet Take 80 mg by mouth at bedtime.       . sitaGLIPtin (JANUVIA) 100 MG tablet Take 100 mg by mouth every morning.       . tiotropium (SPIRIVA) 18 MCG inhalation capsule Place 18 mcg into inhaler and inhale daily.        . TOPROL XL 50 MG 24 hr tablet Take 1 tablet by mouth every morning.       . torsemide (DEMADEX) 20 MG tablet Take 20 mg by mouth daily as needed. For fluid retention      . valsartan (DIOVAN) 160 MG tablet Take 80 mg by mouth 2 (two) times daily.         SURGICAL HISTORY:  Past Surgical History  Procedure Date  . Total abdominal hysterectomy 1985  . Bunionectomy bilateral  . Left elbow surgery   . Mastectomy 2000    right  . Appendectomy 1985  . Cholecystectomy 1985  . Ptca 07/20/2000, 11/13/2003    stents x 2    REVIEW OF SYSTEMS:   General: fatigue (-), night sweats (-), fever (-), pain (-) Lymph: palpable nodes  (-) HEENT: vision changes (-), mucositis (-), gum bleeding (-), epistaxis (-) Cardiovascular: chest pain (-), palpitations (-) Pulmonary: shortness of breath (-), dyspnea on exertion (-), cough (-), hemoptysis (-) GI:  Early satiety (-), melena (-), dysphagia (-), nausea/vomiting (-), diarrhea (-) GU: dysuria (-), hematuria (-), incontinence (-) Musculoskeletal: joint swelling (-), joint pain (-), back pain (-) Neuro: weakness (-), numbness (-), headache (-), confusion (-) Skin: Rash (-), lesions (-), dryness (-) Psych: depression (-), suicidal/homicidal ideation (-), feeling of hopelessness (-)   HEALTH MAINTENANCE:  Mammogram 1 year ago Colonoscopy 03/15/05 Bone  Scan 2-3 years ago Pap Smear s/p TAH/BSO Eye Exam August 2013 Vitamin D 2013 Lipid Panel August 2013  PHYSICAL EXAMINATION: Blood pressure 148/72, pulse 62, temperature 97.9 F (36.6 C), temperature source Oral, resp. rate 20, height 4\' 9"  (1.448 m), weight 142 lb 6.4 oz (64.592 kg). Body mass index is 30.81 kg/(m^2). General: Patient is a well appearing female in no acute distress HEENT: PERRLA, sclerae anicteric no conjunctival pallor, MMM Neck: supple, no palpable adenopathy Lungs: clear to auscultation bilaterally, no wheezes, rhonchi, or rales Cardiovascular: regular rate rhythm, S1, S2, no murmurs, rubs or gallops Abdomen: Soft, non-tender, non-distended, normoactive bowel sounds, no HSM Extremities: warm and well perfused, no clubbing, cyanosis, or edema Skin: No rashes or lesions Neuro: Non-focal ECOG PERFORMANCE STATUS: 1 - Symptomatic but completely ambulatory Right mastectomy site without nodularity, no sign of recurrence.  Left breast without masses or nodules.     LABORATORY DATA: Lab Results  Component Value Date   WBC 7.6 08/16/2012   HGB 11.3* 08/16/2012   HCT 34.7* 08/16/2012   MCV 87.7 08/16/2012   PLT 208 08/16/2012      Chemistry      Component Value Date/Time   NA 140 06/12/2012 0153   NA 143  08/26/2010 1146   K 3.9 06/12/2012 0153   K 4.3 08/26/2010 1146   CL 102 06/12/2012 0153   CL 98 08/26/2010 1146   CO2 33* 06/12/2012 0153   CO2 35* 08/26/2010 1146   BUN 22 06/12/2012 0153   BUN 16 08/26/2010 1146   CREATININE 0.59 06/12/2012 0153   CREATININE 0.7 08/26/2010 1146      Component Value Date/Time   CALCIUM 9.9 06/12/2012 0153   CALCIUM 10.2 08/26/2010 1146   ALKPHOS 36* 08/16/2011  1006   ALKPHOS 36 08/26/2010 1146   AST 11 08/16/2011 1006   AST 19 08/26/2010 1146   ALT 11 08/16/2011 1006   BILITOT 1.2 08/16/2011 1006   BILITOT 1.00 08/26/2010 1146       RADIOGRAPHIC STUDIES:  No results found.  ASSESSMENT: Meghan Casey is a 78 y/o female with metastatic breast carcinoma with a h/o bone metastasis.  She has been off therapy and there has been no evidence of disease progression.  For now we will observe annually.     PLAN:  Meghan Casey is doing well.  No sign of progression.  I ordered her annual diagnostic mammogram.  We will see her back next year.     All questions were answered. The patient knows to call the clinic with any problems, questions or concerns. We can certainly see the patient much sooner if necessary.  I spent 25 minutes counseling the patient face to face. The total time spent in the appointment was 30 minutes.   This case was reviewed with Dr. Welton Flakes. Cherie Ouch Lyn Hollingshead, NP Medical Oncology [facility] Phone: [phone]  08/16/2012, 11:55 AM

## 2012-09-05 ENCOUNTER — Observation Stay (HOSPITAL_COMMUNITY)
Admission: RE | Admit: 2012-09-05 | Discharge: 2012-09-05 | Disposition: A | Discharge status: Home / Self Care | Payer: Medicare Other | Source: Ambulatory Visit | Attending: Cardiology | Admitting: Cardiology

## 2012-09-05 ENCOUNTER — Encounter (HOSPITAL_COMMUNITY)
Admission: RE | Disposition: A | Discharge status: Home / Self Care | Payer: Self-pay | Source: Ambulatory Visit | Attending: Cardiology

## 2012-09-05 DIAGNOSIS — I2510 Atherosclerotic heart disease of native coronary artery without angina pectoris: Secondary | ICD-10-CM | POA: Insufficient documentation

## 2012-09-05 DIAGNOSIS — E119 Type 2 diabetes mellitus without complications: Secondary | ICD-10-CM | POA: Insufficient documentation

## 2012-09-05 DIAGNOSIS — I10 Essential (primary) hypertension: Secondary | ICD-10-CM | POA: Insufficient documentation

## 2012-09-05 DIAGNOSIS — E785 Hyperlipidemia, unspecified: Secondary | ICD-10-CM | POA: Insufficient documentation

## 2012-09-05 DIAGNOSIS — R0989 Other specified symptoms and signs involving the circulatory and respiratory systems: Principal | ICD-10-CM | POA: Insufficient documentation

## 2012-09-05 DIAGNOSIS — R0609 Other forms of dyspnea: Principal | ICD-10-CM | POA: Insufficient documentation

## 2012-09-05 DIAGNOSIS — Z9861 Coronary angioplasty status: Secondary | ICD-10-CM | POA: Insufficient documentation

## 2012-09-05 HISTORY — PX: LEFT HEART CATHETERIZATION WITH CORONARY ANGIOGRAM: SHX5451

## 2012-09-05 LAB — GLUCOSE, CAPILLARY: Glucose-Capillary: 82 mg/dL (ref 70–99)

## 2012-09-05 SURGERY — LEFT HEART CATHETERIZATION WITH CORONARY ANGIOGRAM
Anesthesia: LOCAL

## 2012-09-05 MED ORDER — ACETAMINOPHEN 325 MG PO TABS: 650.0000 mg | ORAL_TABLET | ORAL | Status: DC | PRN

## 2012-09-05 MED ORDER — ONDANSETRON HCL 4 MG/2ML IJ SOLN: 4.0000 mg | Freq: Four times a day (QID) | INTRAMUSCULAR | Status: DC | PRN

## 2012-09-05 MED ORDER — POTASSIUM CHLORIDE CRYS ER 20 MEQ PO TBCR: 20.0000 meq | EXTENDED_RELEASE_TABLET | Freq: Once | ORAL | Status: DC

## 2012-09-05 MED ORDER — SODIUM CHLORIDE 0.9 % IJ SOLN: 3.0000 mL | Freq: Two times a day (BID) | INTRAMUSCULAR | Status: DC

## 2012-09-05 MED ORDER — MIDAZOLAM HCL 2 MG/2ML IJ SOLN
INTRAMUSCULAR | Status: AC
  Filled 2012-09-05: qty 2

## 2012-09-05 MED ORDER — POTASSIUM CHLORIDE CRYS ER 20 MEQ PO TBCR
EXTENDED_RELEASE_TABLET | ORAL | Status: AC
  Filled 2012-09-05: qty 1

## 2012-09-05 MED ORDER — SODIUM CHLORIDE 0.9 % IJ SOLN: 3.0000 mL | INTRAMUSCULAR | Status: DC | PRN

## 2012-09-05 MED ORDER — HEPARIN SODIUM (PORCINE) 1000 UNIT/ML IJ SOLN
INTRAMUSCULAR | Status: AC
  Filled 2012-09-05: qty 1

## 2012-09-05 MED ORDER — SODIUM CHLORIDE 0.9 % IV SOLN
INTRAVENOUS | Status: DC
  Administered 2012-09-05: 10:00:00 via INTRAVENOUS

## 2012-09-05 MED ORDER — FENTANYL CITRATE 0.05 MG/ML IJ SOLN
INTRAMUSCULAR | Status: AC
  Filled 2012-09-05: qty 2

## 2012-09-05 MED ORDER — SODIUM CHLORIDE 0.9 % IV SOLN: 250.0000 mL | INTRAVENOUS | Status: DC | PRN

## 2012-09-05 MED ORDER — ASPIRIN 81 MG PO CHEW
CHEWABLE_TABLET | ORAL | Status: AC
  Administered 2012-09-05: 324 mg
  Filled 2012-09-05: qty 4

## 2012-09-05 MED ORDER — SODIUM CHLORIDE 0.9 % IV SOLN: 1.0000 mL/kg/h | INTRAVENOUS | Status: DC

## 2012-09-05 MED ORDER — VERAPAMIL HCL 2.5 MG/ML IV SOLN
INTRAVENOUS | Status: AC
  Filled 2012-09-05: qty 2

## 2012-09-05 MED ORDER — ASPIRIN 81 MG PO CHEW: 324.0000 mg | CHEWABLE_TABLET | ORAL | Status: DC

## 2012-09-05 NOTE — H&P (Signed)
  Please see paper chart  

## 2012-09-05 NOTE — Interval H&P Note (Signed)
History and Physical Interval Note:  09/05/2012 10:27 AM  Meghan Casey  has presented today for surgery, with the diagnosis of Chest pain  The various methods of treatment have been discussed with the patient and family. After consideration of risks, benefits and other options for treatment, the patient has consented to  Procedure(s) (LRB) with comments: LEFT HEART CATHETERIZATION WITH CORONARY ANGIOGRAM (N/A) and possible angioplasty as a surgical intervention .  The patient's history has been reviewed, patient examined, no change in status, stable for surgery.  I have reviewed the patient's chart and labs.  Questions were answered to the patient's satisfaction.     [name]

## 2012-09-05 NOTE — CV Procedure (Addendum)
Procedure performed:  Left heart catheterization including non selective right and selective  left coronary arteriography.    Indication: patient is a 78 year-old female with history of hypertension (yes), hyperlipidemia (yes), Diabetes Mellitus (yes)  who presents with worsening dyspnea and abnormal EKG. Patient has prior PTCA remotely and stents to the LAD and RCA in [place]  Hence is brought to the cardiac catheterization lab to evaluate his coronary anatomy. Given new onset symptoms and abnormal EKG showing anterolateral ST-T changes suggestive of ischemia, patient was brought to the cardiac cath lab  Hemodynamic data:  Right coronary artery: The vessel is smooth with mild luminal irregularity. It is dominant. No significant stenosis. Midsegment of the right coronary artery has a history placed stent which is widely patent.  Left main coronary artery is large and normal. Mild calcification is evident  Circumflex coronary artery: A moderate caliber vessel  giving origin to a small  obtuse marginal 1. Midsegment of the circumflex has a 50-60% stenosis which is eccentric and calcified  LAD:  LAD gives origin to a small  diagonal 1 which is stent jailed and has a ostial 80% stenosis . LAD has  mild diffuse luminal irregularity. Midsegment of the LAD has a widely patent stent with very mild new intimal hyperplasia . No high-grade stenosis was evident.    impression:    There is moderate stenosis of the circumflex coronary artery in the midsegment  with a 50-60% stenosis and appears to be eccentric. Ostial circumflex appeared to be mildly calcified with mild luminal irregularity. Patent mid LAD and mid RCA stent.  Given her multiple comorbidities, as there is no high-grade stenosis, I did not attempt to do any further evaluation of the stenosis which is at most intermediate in severity. Patient is not a candidate for CABG. I will manage her medically.   abdominal and thoracic aorta was seen a Cined  as there was severe tortuosity of both the thoracoabdominal aorta and descending aorta.   Technique: initially I attempted to perform left heart catheterization via right radial arterial access. There was a radial loop, was also evident the subclavian artery severe tortuosity and looping. Hence the procedure was abandoned and TR band was applied after withdrawing the TIG 4  catheter.  Under sterile precautions using a 5 French right femoral arterial access, a 5 French sheath was introduced into the right femoral artery under fluoroscopy guidance. A 5 [place]  JL4  catheter was advanced into the ascending aorta then left coronary artery was cannulated and angiography was performed in multiple views.  the cath was then poorer body over exchange length J-wire and a JR 4 diagnostic catheter was utilized and I attempted to initially engage the right coronary artery, but due to the inability to torque the  Catheter, I was nonselectively visualized the right coronary artery by performing ascending aortogram and gentamicin was very well seen. Catheter exchanged out of the body over J-Wire. NO immediate complications noted. Patient tolerated the procedure well.   Disposition: Will be discharged home today with outpatient follow up.

## 2012-09-28 ENCOUNTER — Ambulatory Visit
Admission: RE | Admit: 2012-09-28 | Discharge: 2012-09-28 | Disposition: A | Discharge status: Home / Self Care | Payer: Medicare Other | Source: Ambulatory Visit | Attending: Adult Health | Admitting: Adult Health

## 2012-09-28 DIAGNOSIS — Z853 Personal history of malignant neoplasm of breast: Secondary | ICD-10-CM

## 2013-08-17 ENCOUNTER — Other Ambulatory Visit: Payer: Self-pay | Admitting: Family

## 2013-08-17 DIAGNOSIS — C50919 Malignant neoplasm of unspecified site of unspecified female breast: Secondary | ICD-10-CM

## 2013-08-20 ENCOUNTER — Encounter: Payer: Self-pay | Admitting: Family

## 2013-08-20 ENCOUNTER — Encounter (INDEPENDENT_AMBULATORY_CARE_PROVIDER_SITE_OTHER): Payer: Self-pay

## 2013-08-20 ENCOUNTER — Ambulatory Visit (HOSPITAL_BASED_OUTPATIENT_CLINIC_OR_DEPARTMENT_OTHER): Payer: Medicare Other | Admitting: Family

## 2013-08-20 ENCOUNTER — Other Ambulatory Visit (HOSPITAL_BASED_OUTPATIENT_CLINIC_OR_DEPARTMENT_OTHER): Payer: Medicare Other | Admitting: Lab

## 2013-08-20 ENCOUNTER — Ambulatory Visit: Payer: Medicare Other | Admitting: Oncology

## 2013-08-20 ENCOUNTER — Telehealth: Payer: Self-pay | Admitting: *Deleted

## 2013-08-20 VITALS — BP 146/70 | HR 58 | Temp 97.9°F | Resp 18 | Ht 60.0 in | Wt 145.2 lb

## 2013-08-20 DIAGNOSIS — C50919 Malignant neoplasm of unspecified site of unspecified female breast: Secondary | ICD-10-CM

## 2013-08-20 DIAGNOSIS — G8929 Other chronic pain: Secondary | ICD-10-CM

## 2013-08-20 DIAGNOSIS — Z85830 Personal history of malignant neoplasm of bone: Secondary | ICD-10-CM

## 2013-08-20 DIAGNOSIS — M549 Dorsalgia, unspecified: Secondary | ICD-10-CM

## 2013-08-20 DIAGNOSIS — Z853 Personal history of malignant neoplasm of breast: Secondary | ICD-10-CM

## 2013-08-20 DIAGNOSIS — R0602 Shortness of breath: Secondary | ICD-10-CM

## 2013-08-20 LAB — CBC WITH DIFFERENTIAL/PLATELET
BASO%: 0.4 % (ref 0.0–2.0)
Eosinophils Absolute: 0.1 10*3/uL (ref 0.0–0.5)
LYMPH%: 13.2 % — ABNORMAL LOW (ref 14.0–49.7)
MCHC: 32.7 g/dL (ref 31.5–36.0)
MONO#: 0.6 10*3/uL (ref 0.1–0.9)
NEUT#: 6.6 10*3/uL — ABNORMAL HIGH (ref 1.5–6.5)
RBC: 3.85 10*6/uL (ref 3.70–5.45)
RDW: 15 % — ABNORMAL HIGH (ref 11.2–14.5)
WBC: 8.5 10*3/uL (ref 3.9–10.3)

## 2013-08-20 LAB — COMPREHENSIVE METABOLIC PANEL (CC13)
ALT: 12 U/L (ref 0–55)
Albumin: 3.3 g/dL — ABNORMAL LOW (ref 3.5–5.0)
Alkaline Phosphatase: 46 U/L (ref 40–150)
CO2: 35 mEq/L — ABNORMAL HIGH (ref 22–29)
Glucose: 205 mg/dl — ABNORMAL HIGH (ref 70–140)
Potassium: 3.8 mEq/L (ref 3.5–5.1)
Sodium: 144 mEq/L (ref 136–145)
Total Bilirubin: 1.15 mg/dL (ref 0.20–1.20)
Total Protein: 6.4 g/dL (ref 6.4–8.3)

## 2013-08-20 NOTE — Patient Instructions (Signed)
Please contact us at [phone] if you have any questions or concerns.  Please continue to do well and enjoy life!!!  Get plenty of rest, drink plenty of water, exercise daily (chair exercises as tolerated), eat a balanced diet.  Take vitamin D3 1000 IUs daily.   Complete monthly self-breast examinations.  Have a clinical breast exam by a physician every year.  Have your mammogram completed every year.  Results for orders placed in visit on 08/20/13 (from the past 24 hour(s))  CBC WITH DIFFERENTIAL     Status: Abnormal   Collection Time    08/20/13 10:30 AM      Result Value Range   WBC 8.5  3.9 - 10.3 10e3/uL   NEUT# 6.6 (*) 1.5 - 6.5 10e3/uL   HGB 11.9  11.6 - 15.9 g/dL   HCT 40.9  81.1 - 91.4 %   Platelets 227  145 - 400 10e3/uL   MCV 94.9  79.5 - 101.0 fL   MCH 31.0  25.1 - 34.0 pg   MCHC 32.7  31.5 - 36.0 g/dL   RBC 7.82  9.56 - 2.13 10e6/uL   RDW 15.0 (*) 11.2 - 14.5 %   lymph# 1.1  0.9 - 3.3 10e3/uL   MONO# 0.6  0.1 - 0.9 10e3/uL   Eosinophils Absolute 0.1  0.0 - 0.5 10e3/uL   Basophils Absolute 0.0  0.0 - 0.1 10e3/uL   NEUT% 77.7 (*) 38.4 - 76.8 %   LYMPH% 13.2 (*) 14.0 - 49.7 %   MONO% 7.5  0.0 - 14.0 %   EOS% 1.2  0.0 - 7.0 %   BASO% 0.4  0.0 - 2.0 %   Narrative:    Performed At:  [facility]               [address]               [place], [place] [address]

## 2013-08-20 NOTE — Progress Notes (Addendum)
[facility]  [phone] [phone]  OFFICE PROGRESS NOTE  ID: Meghan Casey   DOB: March 19, 1934  MR#: [id number]  JYN#:[id number]   PCP: Pearson Grippe, MD  DIAGNOSIS: Meghan Casey is a 79 y.o. female with history of metastatic breast carcinoma with metastasis to the bone.  HPI:   From Dr. Feliz Beam new patient evaluation note dated 02/10/2004: "79 year old female with history of T2,N0 breast carcinoma with metastatic disease to the bones.  Patient originally underwent a right mastectomy in 06/1999 which revealed a 2.5 cm tumor, node negative, ER/PR positive.  In 07/1999 she was begun on tamoxifen.  However, in 11/1999 patient started having pain and a bone scan revealed metastatic involvement of the right rib cage.  In 01/2000, she was changed over to Femara 2.5 mg, which she has been on ever since.  Patient had originally been seen by Dr. Catha Gosselin and then Dr. Filbert Berthold, and she is now being seen for the first time by me today.  She will complete her therapy in 01/2005."  Her subsequent history is as detailed below.     PRIOR THERAPY: 1.  Right breast needle core biopsy on 06/02/1999 which showed a moderately differentiated invasive ductal carcinoma.  2.   Right mastectomy in 06/1999 for a stage IIA, T2N0,  2.5 cm invasive breast carcinoma, estrogen receptor positive, progesterone receptor-positive, node-negative disease.    3. Antiestrogen therapy with Tamoxifen 20 mg by mouth daily from 07/1999 until 01/2000.  4.  PET scan on 01/25/2000 showed abnormal focus of increased activity in a linear distribution of the right anterior rib in the region of the eighth rib compatible with metastatic breast carcinoma.  5. Antiestrogen therapy with Femara 2.5 mg by mouth daily started in 01/2000 and ended in 03/2005.   CURRENT THERAPY: Observation   INTERVAL HISTORY: Meghan Casey is 79 y.o. female who returns for follow up of metastatic breast cancer with  metastasis to the bone.  She is accompanied by her friend Gaetana Michaelis for today's office visit.  Since her last office visit on 08/16/2012 she's been doing fairly well with the exception of chronic shortness of breath and chronic back pain (she has a history of polio).  She presents today in a wheelchair with portable oxygen via nasal cannula at 4 L per minute.  Her interval history is otherwise unremarkable and stable.   MEDICAL HISTORY: Past Medical History  Diagnosis Date  . Diabetes mellitus, type 2   . Hyperlipidemia   . Hypertension   . Atrial fibrillation   . CAD (coronary artery disease)   . CHF (congestive heart failure)   . Sleep apnea   . Post-polio syndrome   . Scoliosis   . GI bleed   . COPD (chronic obstructive pulmonary disease)   . CHF (congestive heart failure)   . Bone metastases   . Breast cancer metastasized to bone 08/16/2011  . Diverticulosis   . GERD (gastroesophageal reflux disease)   . Asthma   . History of colonic polyps   . Depression   . History of jaundice   . Dementia     ALLERGIES:  is allergic to dipyridamole; milk-related compounds; and peanut-containing drug products.  MEDICATIONS:  Current Outpatient Prescriptions  Medication Sig Dispense Refill  . aspirin 81 MG tablet 2 tablets daily      . calcium carbonate (TUMS - DOSED IN MG ELEMENTAL CALCIUM) 500 MG chewable tablet Chew 2 tablets by mouth daily as needed.       Marland Kitchen  clopidogrel (PLAVIX) 75 MG tablet Take 75 mg by mouth daily.        . digoxin (LANOXIN) 0.25 MG tablet Take 0.25 mg by mouth every morning.       . diphenhydramine-acetaminophen (TYLENOL PM) 25-500 MG TABS Take 1 tablet by mouth at bedtime.       . Fluticasone-Salmeterol (ADVAIR) 250-50 MCG/DOSE AEPB Inhale 1 puff into the lungs every 12 (twelve) hours.        . folic acid (FOLVITE) 800 MCG tablet Take 400 mcg by mouth at bedtime.       Marland Kitchen glimepiride (AMARYL) 2 MG tablet 3 mg. Take 1 and one half tablets with supper      .  Glucosamine Sulfate 750 MG TABS Take 750 mg by mouth 2 (two) times daily.       . isosorbide mononitrate (IMDUR) 30 MG 24 hr tablet Take 30 mg by mouth every morning.      . montelukast (SINGULAIR) 10 MG tablet Take 10 mg by mouth daily as needed. For allergies      . Multiple Vitamin (MULTIVITAMIN) capsule Take 1 capsule by mouth daily at 12 noon.       March Rummage (EQ EYE ALLERGY RELIEF) 0.027-0.315 % SOLN Apply 1 drop to eye 2 (two) times daily as needed. For irritation      . nitroGLYCERIN (NITROSTAT) 0.4 MG SL tablet Place 0.4 mg under the tongue every 5 (five) minutes x 3 doses as needed. For chest pain      . oxyCODONE-acetaminophen (PERCOCET) 7.5-325 MG per tablet Take 1 tablet by mouth every 4 (four) hours as needed. For pain Patient takes once daily in the morning      . pantoprazole (PROTONIX) 40 MG tablet Take 40 mg by mouth 2 (two) times daily.       . pravastatin (PRAVACHOL) 80 MG tablet Take 80 mg by mouth at bedtime.       . sitaGLIPtin (JANUVIA) 100 MG tablet Take 100 mg by mouth every morning.       . tiotropium (SPIRIVA) 18 MCG inhalation capsule Place 18 mcg into inhaler and inhale daily.        . TOPROL XL 50 MG 24 hr tablet Take 1 tablet by mouth every morning.       . torsemide (DEMADEX) 20 MG tablet Take 20 mg by mouth daily as needed. For fluid retention      . valsartan (DIOVAN) 160 MG tablet Take 80 mg by mouth 2 (two) times daily.        No current facility-administered medications for this visit.    SURGICAL HISTORY:  Past Surgical History  Procedure Laterality Date  . Total abdominal hysterectomy  1985  . Bunionectomy  bilateral  . Left elbow surgery    . Mastectomy  2000    right  . Appendectomy  1985  . Cholecystectomy  1985  . Ptca  07/20/2000, 11/13/2003    stents x 2    REVIEW OF SYSTEMS:   A 10 point review of systems was completed and is negative except as noted above.  Meghan Casey denies any other symptomatology including  fatigue, fever  or chills, headache, vision changes, swollen glands, cough, chest pain or discomfort, nausea, vomiting, diarrhea, constipation, change in urinary or bowel habits, any other arthralgias/myalgias, unusual bleeding/bruising or any other symptomatology.   PHYSICAL EXAMINATION: Blood pressure 146/70, pulse 58, temperature 97.9 F (36.6 C), temperature source Oral, resp. rate 18, height 5' (1.524 m),  weight 145 lb 3.2 oz (65.862 kg), SpO2 94.00%. on 4 LPM via nasal cannula Body mass index is 28.36 kg/(m^2).  ECOG PERFORMANCE STATUS: 3 - Symptomatic, >50% confined to bed  General appearance: Alert, cooperative, well nourished, no apparent distress, in a wheelchair with portable oxygen at 4 L per minute Head: Normocephalic, without obvious abnormality, atraumatic Eyes: Arcus senilis, PERRLA, EOMI Nose: Nares, septum and mucosa are normal, no drainage or sinus tenderness Neck: No adenopathy, supple, symmetrical, trachea midline, no tenderness Resp: Clear to auscultation bilaterally, no wheezes/rales/rhonchi, diminished bibasilar breath sounds Cardio: Regular rate and rhythm, S1, S2 normal, 4/6 systolic murmur, no click, rub or gallop, +1 bilateral lower extremity generalized edema Breasts: Left breast is pendulous, right breast is surgically absent and has well-healed surgical scars, glandular tissue in left breast, bilateral axillary fullness GI: Soft, distended, non-tender, hypoactive bowel sounds, organomegaly cannot be properly assessed from a sitting position Skin: No rashes/lesions, skin warm and dry, no erythematous areas, no cyanosis  M/S:  Visible kyphoscoliosis, Atraumatic, limited strength and range of motion in all extremities, no clubbing  Lymph nodes: Cervical, supraclavicular, and axillary nodes normal Neurologic: Cranial nerves II through XII intact, alert and oriented x 3 Psych: Appropriate affect    LABORATORY DATA: Lab Results  Component Value Date   WBC 8.5 08/20/2013   HGB  11.9 08/20/2013   HCT 36.5 08/20/2013   MCV 94.9 08/20/2013   PLT 227 08/20/2013      Chemistry      Component Value Date/Time   NA 144 08/20/2013 1030   NA 140 06/12/2012 0153   NA 143 08/26/2010 1146   K 3.8 08/20/2013 1030   K 3.9 06/12/2012 0153   K 4.3 08/26/2010 1146   CL 102 08/16/2012 1056   CL 102 06/12/2012 0153   CL 98 08/26/2010 1146   CO2 35* 08/20/2013 1030   CO2 33* 06/12/2012 0153   CO2 35* 08/26/2010 1146   BUN 17.1 08/20/2013 1030   BUN 22 06/12/2012 0153   BUN 16 08/26/2010 1146   CREATININE 0.8 08/20/2013 1030   CREATININE 0.59 06/12/2012 0153   CREATININE 0.7 08/26/2010 1146      Component Value Date/Time   CALCIUM 10.4 08/20/2013 1030   CALCIUM 9.9 06/12/2012 0153   CALCIUM 10.2 08/26/2010 1146   ALKPHOS 46 08/20/2013 1030   ALKPHOS 36* 08/16/2011 1006   ALKPHOS 36 08/26/2010 1146   AST 13 08/20/2013 1030   AST 11 08/16/2011 1006   AST 19 08/26/2010 1146   ALT 12 08/20/2013 1030   ALT 11 08/16/2011 1006   ALT 15 08/26/2010 1146   BILITOT 1.15 08/20/2013 1030   BILITOT 1.2 08/16/2011 1006   BILITOT 1.00 08/26/2010 1146       RADIOGRAPHIC STUDIES: Mm Digital Screening Unilat L 09/29/2012   *RADIOLOGY REPORT*  Clinical Data:  Screening.  LEFT DIGITAL SCREENING MAMMOGRAM WITH CAD  Comparison: None.  FINDGINS:  ACR Breast Density Category: 4: The breast tissue is extremely dense.  The patient has had a right mastectomy.  No suspicious masses, architectural distortion, or calcifications are present.  Images were processed with CAD.  IMPRESSION: No evidence of malignancy.  Screening mammography is recommended in one year.  RECOMMENDATION: Screening mammogram in one year. (Code:SM-B-01Y)  BI-RADS CATEGORY 1:  Negative.   Original Report Authenticated By: Beckie Salts, M.D.     ASSESSMENT:  Meghan Casey is a 79 y.o. female with:  1. Right breast needle core biopsy on 06/02/1999 which  showed a moderately differentiated invasive ductal carcinoma.  2.  Right  mastectomy in 06/1999 for a stage IIA, T2N0,  2.5 cm invasive breast carcinoma, estrogen receptor positive, progesterone receptor-positive, node-negative disease.    3. Antiestrogen therapy with Tamoxifen 20 mg by mouth daily from 07/1999 until 01/2000.  4.  PET scan on 01/25/2000 showed abnormal focus of increased activity in a linear distribution of the right anterior rib in the region of the eighth rib compatible with metastatic breast carcinoma.  5. Antiestrogen therapy with Femara 2.5 mg by mouth daily started in 01/2000 and ended in 03/2005.  Currently on observation only.  6.  Chronic back pain and chronic shortness of breath - followed by her primary care physician.   PLAN:  1.  We plan to see Meghan Casey again in one year at which time we will check laboratories of CBC, CMP, and vitamin D level.  We will order her annual digital screening unilateral left mammogram for her.  All questions answered.  Meghan Casey and her friend Gaetana Michaelis were encouraged to contact us in the interim with any questions, concerns, or problems.   Larina Bras, NP-C 08/24/2013    5:52 PM  ATTENDING'S ATTESTATION:  I personally reviewed patient's chart, examined patient myself, formulated the treatment plan as followed.    Overall patient is doing well. She has no evidence of recurrent disease. We will continue to see her on a yearly basis  Drue Second, MD Medical/Oncology [facility] [phone] (beeper) [phone] (Office)  09/10/2013, 3:26 AM

## 2013-08-20 NOTE — Telephone Encounter (Signed)
appts made and printed...td 

## 2013-10-02 ENCOUNTER — Ambulatory Visit
Admission: RE | Admit: 2013-10-02 | Discharge: 2013-10-02 | Disposition: A | Discharge status: Home / Self Care | Payer: Medicare Other | Source: Ambulatory Visit | Attending: Family | Admitting: Family

## 2013-10-02 DIAGNOSIS — C50919 Malignant neoplasm of unspecified site of unspecified female breast: Secondary | ICD-10-CM

## 2013-11-11 DEATH — deceased

## 2013-11-20 ENCOUNTER — Telehealth: Payer: Self-pay

## 2013-11-20 NOTE — Telephone Encounter (Signed)
Patient past away per Obituary in [place] °

## 2014-01-25 IMAGING — CR DG CHEST 1V PORT
1 series · 1 of 1 positions shown · non-contrast
Comparison: 01/06/2010 most recent.

CLINICAL DATA: Severe shortness of breath.

PORTABLE CHEST - 1 VIEW

[AP]
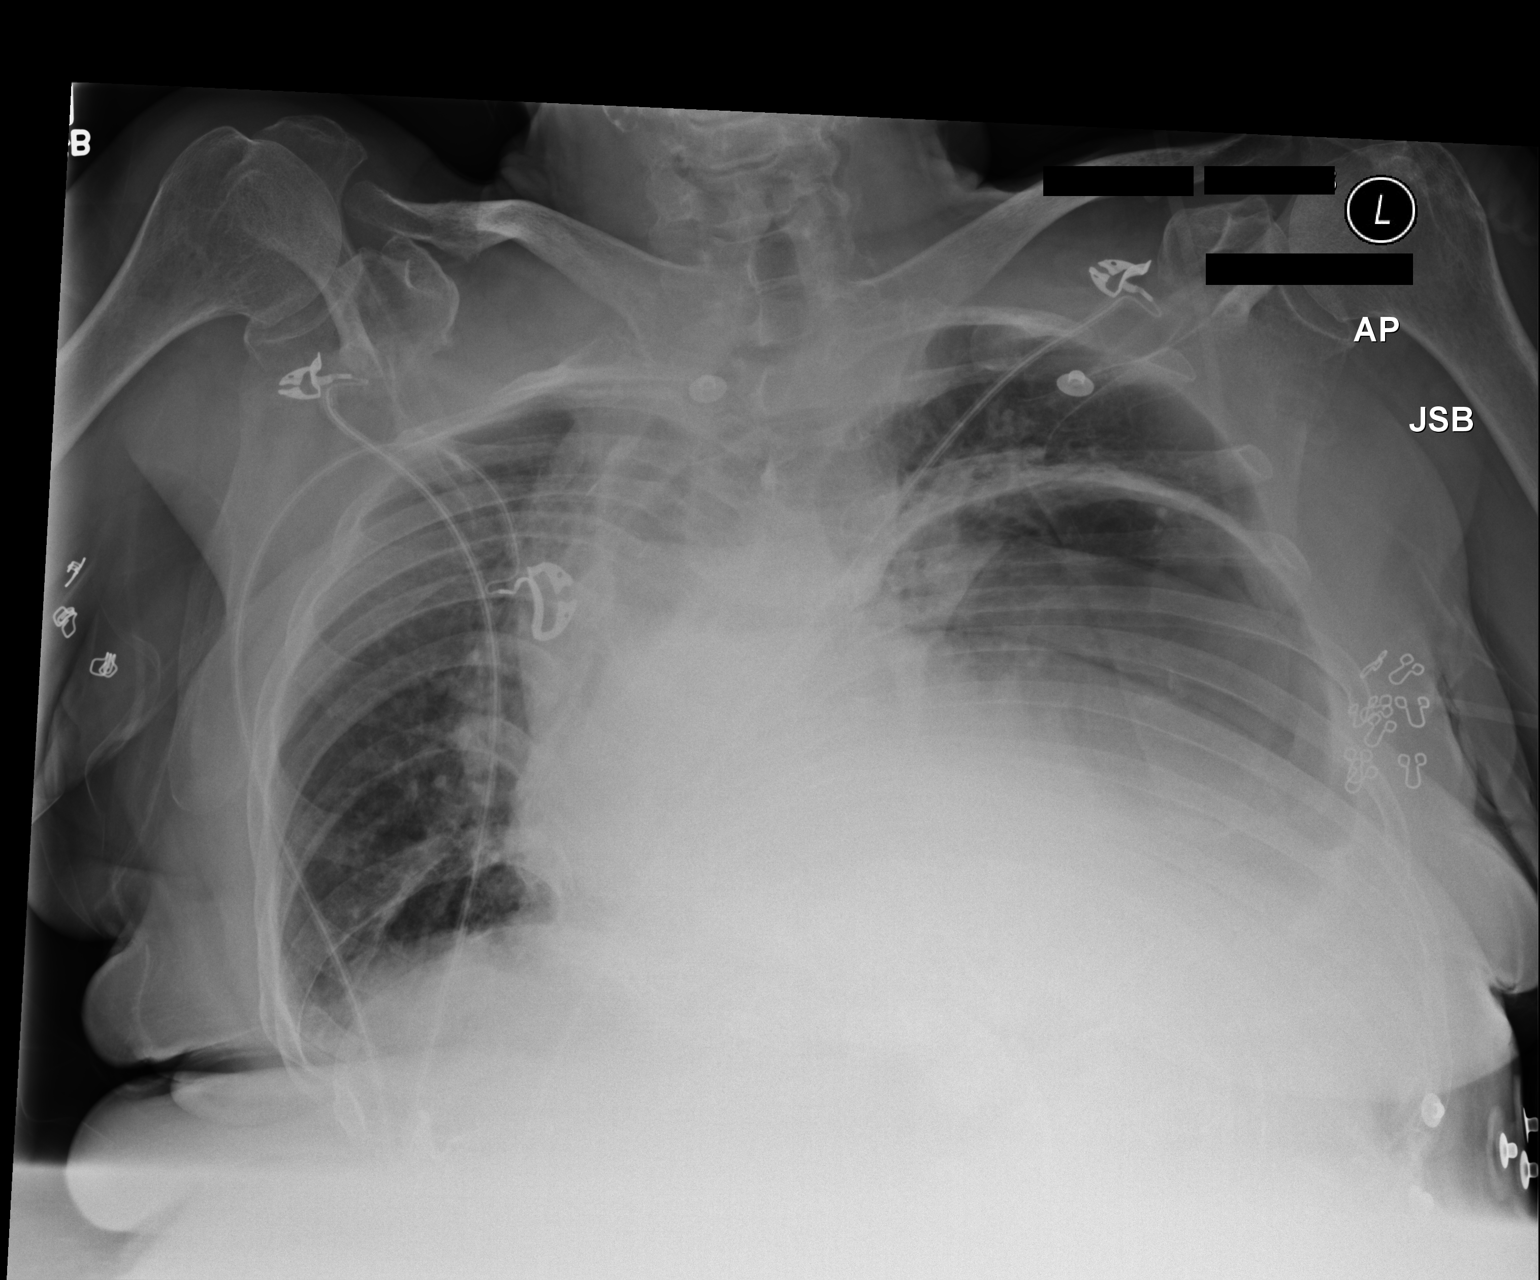

[1 of 1 positions shown; findings below may reference images not displayed]

FINDINGS: Severe S-shaped thoracolumbar scoliosis with thoracic
curvature to the right.  Cardiomegaly.  Upward displacement of the
gastric bubble obscures much of the left hemithorax but the lungs
are grossly clear.  No definite effusion or pneumothorax.
IMPRESSION: Chronic changes as described.  Cardiomegaly.  Limited exam without
definite acute abnormality.

## 2014-08-22 ENCOUNTER — Other Ambulatory Visit: Payer: Medicare Other

## 2014-08-22 ENCOUNTER — Ambulatory Visit: Payer: Medicare Other | Admitting: Oncology

## 2014-09-19 ENCOUNTER — Encounter (HOSPITAL_COMMUNITY): Payer: Self-pay | Admitting: Cardiology
